# Patient Record
Sex: Female | Born: 1985 | Race: White | Hispanic: No | Marital: Married | Smoking: Former smoker
Health system: Southern US, Community
[De-identification: ages and names within clinical notes are randomized; demographics above are authoritative.]

## PROBLEM LIST (undated history)

## (undated) DIAGNOSIS — K219 Gastro-esophageal reflux disease without esophagitis: Secondary | ICD-10-CM

## (undated) DIAGNOSIS — J189 Pneumonia, unspecified organism: Secondary | ICD-10-CM

## (undated) DIAGNOSIS — I10 Essential (primary) hypertension: Secondary | ICD-10-CM

## (undated) HISTORY — PX: MANDIBLE FRACTURE SURGERY: SHX706

---
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
## 2006-10-29 ENCOUNTER — Emergency Department: Payer: Self-pay

## 2008-11-24 ENCOUNTER — Emergency Department: Payer: Self-pay | Admitting: Emergency Medicine

## 2010-05-18 ENCOUNTER — Emergency Department: Payer: Self-pay | Admitting: Emergency Medicine

## 2010-09-17 ENCOUNTER — Emergency Department: Payer: Self-pay | Admitting: Emergency Medicine

## 2010-12-29 ENCOUNTER — Emergency Department: Payer: Self-pay | Admitting: Unknown Physician Specialty

## 2011-10-07 ENCOUNTER — Inpatient Hospital Stay: Payer: Self-pay | Admitting: Internal Medicine

## 2011-10-07 LAB — CBC
HCT: 39.6 % (ref 35.0–47.0)
HGB: 13.3 g/dL (ref 12.0–16.0)
MCHC: 33.6 g/dL (ref 32.0–36.0)
MCV: 91 fL (ref 80–100)
Platelet: 296 10*3/uL (ref 150–440)
RBC: 4.37 10*6/uL (ref 3.80–5.20)
RDW: 12.9 % (ref 11.5–14.5)

## 2011-10-07 LAB — COMPREHENSIVE METABOLIC PANEL
Albumin: 4.1 g/dL (ref 3.4–5.0)
Alkaline Phosphatase: 65 U/L (ref 50–136)
Calcium, Total: 9.6 mg/dL (ref 8.5–10.1)
Co2: 24 mmol/L (ref 21–32)
Glucose: 86 mg/dL (ref 65–99)
SGOT(AST): 72 U/L — ABNORMAL HIGH (ref 15–37)
Sodium: 141 mmol/L (ref 136–145)
Total Protein: 8.4 g/dL — ABNORMAL HIGH (ref 6.4–8.2)

## 2011-10-07 LAB — PREGNANCY, URINE: Pregnancy Test, Urine: NEGATIVE m[IU]/mL

## 2011-10-08 LAB — CBC WITH DIFFERENTIAL/PLATELET
Basophil #: 0 10*3/uL (ref 0.0–0.1)
Eosinophil %: 0 %
HCT: 35.2 % (ref 35.0–47.0)
HGB: 11.7 g/dL — ABNORMAL LOW (ref 12.0–16.0)
Lymphocyte #: 0.7 10*3/uL — ABNORMAL LOW (ref 1.0–3.6)
MCH: 29.9 pg (ref 26.0–34.0)
MCHC: 33.2 g/dL (ref 32.0–36.0)
MCV: 90 fL (ref 80–100)
Monocyte %: 1.6 %
RDW: 12.9 % (ref 11.5–14.5)
WBC: 11.2 10*3/uL — ABNORMAL HIGH (ref 3.6–11.0)

## 2011-10-08 LAB — HEPATIC FUNCTION PANEL A (ARMC)
Alkaline Phosphatase: 56 U/L (ref 50–136)
SGOT(AST): 65 U/L — ABNORMAL HIGH (ref 15–37)
SGPT (ALT): 19 U/L
Total Protein: 7.6 g/dL (ref 6.4–8.2)

## 2011-10-08 LAB — BASIC METABOLIC PANEL
Anion Gap: 11 (ref 7–16)
Calcium, Total: 9.3 mg/dL (ref 8.5–10.1)
Chloride: 108 mmol/L — ABNORMAL HIGH (ref 98–107)
Co2: 22 mmol/L (ref 21–32)
Creatinine: 0.88 mg/dL (ref 0.60–1.30)
EGFR (Non-African Amer.): >60
Glucose: 141 mg/dL — ABNORMAL HIGH (ref 65–99)
Osmolality: 283 (ref 275–301)
Potassium: 4.4 mmol/L (ref 3.5–5.1)
Sodium: 141 mmol/L (ref 136–145)

## 2011-10-15 LAB — EXPECTORATED SPUTUM ASSESSMENT W GRAM STAIN, RFLX TO RESP C

## 2012-05-19 ENCOUNTER — Ambulatory Visit: Payer: Self-pay | Admitting: General Practice

## 2013-11-27 ENCOUNTER — Ambulatory Visit: Payer: Self-pay | Admitting: General Practice

## 2015-01-05 NOTE — Consult Note (Signed)
PATIENT NAME:  Linda Benitez, Linda Benitez MR#:  [id number] DATE OF BIRTH:  07/16/1986  DATE OF CONSULTATION:  10/09/2011  CONSULTING PHYSICIAN:  Yevonne PaxSaadat A. Eliezer Khawaja, MD  REASON FOR CONSULTATION: Acute pneumonia.   HISTORY OF PRESENT ILLNESS: The patient is a 29 year old female who has past medical history significant for childhood asthma which was very transient. She came into the hospital because she has not been quite feeling well. She says that she first got sick around Christmas. At that time she went to a physician and  was given some Z-Pak. The patient also stated at that time she started using albuterol inhaler. She said that she had been having some cough and congestion, and she was bringing up some sputum. There was no hemoptysis noted. She was not able to sleep well, and also she said that she had such severe coughing that her ribs began to hurt. When she this ill, she came into the Emergency Room. At the time that she was seen she was started on steroids and antibiotics, and she says that since admission she has been feeling better.   PAST MEDICAL HISTORY: Significant for asthma.   PAST SURGICAL HISTORY: Jaw fracture.   SOCIAL HISTORY: Positive for tobacco use. She quit the first time that she got ill, which would be back in December. She works in an office.   FAMILY HISTORY: Negative other than for renal cell carcinoma in her father.   REVIEW OF SYSTEMS: GENERAL: Generally, she has been having fatigue and inability to sleep well. ENT: Positive for runny nose. RESPIRATORY: Positive for cough and shortness of breath. CARDIOVASCULAR: Positive for pain in the chest related to the coughing. GASTROINTESTINAL: Negative for any nausea or vomiting. There was some diarrhea noted. GENITOURINARY: Negative for any hematuria. MUSCULOSKELETAL: Negative for any arthropathy. SKIN: Without rashes. HEMATOLOGIC/LYMPHATIC: Negative for any bruising or bleeding. PSYCHIATRIC: Negative for depression or  anxiety.  PHYSICAL EXAMINATION:  GENERAL: At the time that she was seen she was awake. She was comfortable.   VITAL SIGNS: She had a temperature of 98.4, pulse 81, respiratory rate 20, blood pressure 102/63, saturations were 94%.   NECK: Neck was supple. There was no JVD. No adenopathy. No thyromegaly.   CHEST: Some coarse breath sounds. A few scattered rhonchi. There were no rales.   CARDIOVASCULAR: S1, S2 normal. Regular rhythm. No gallop or rub.   ABDOMEN: Soft and nontender.   EXTREMITIES: Without cyanosis or clubbing. Pulses equal.   NEUROLOGIC: She was awake and alert, moving all four extremities.   MUSCULOSKELETAL: Without any acute synovitis.   LABORATORY, DIAGNOSTIC AND RADIOLOGICAL DATA:  CT chest was shown which showed no pulmonary emboli, but she did have patchy infiltrates consistent with pneumonitis.  Also, she had a hepatitis screen done which was negative.  She had an admission CBC which was 14.8. Follow-up CBC with showed white count down to 11.2   IMPRESSION: Acute pneumonitis.   PLAN: She is right now being given Levaquin, and this should be continued. She was also given steroids which likely helped. She will continue with her inhalers. I would suggest on discharge that should follow-up in the office so we can make certain that this has cleared. I will make further recommendations as deemed necessary.  ____________________________ Yevonne PaxSaadat A. Guido Comp, MD sak:cbb D: 10/09/2011 20:06:27 ET T: 10/10/2011 15:55:31 ET JOB#: [id number]  cc: Yevonne PaxSaadat A. Lilliam Chamblee, MD, <Dictator> Yevonne PaxSAADAT A Tamir Wallman MD ELECTRONICALLY SIGNED 11/12/2011 10:17

## 2015-01-05 NOTE — H&P (Signed)
PATIENT NAME:  Lonni FixSTALLINGS, Darcel R MR#:  [id number] DATE OF BIRTH:  [birth year]  DATE OF ADMISSION:  10/07/2011  PRIMARY CARE PHYSICIAN: None.   CHIEF COMPLAINT: Sick since Christmas.   HISTORY OF PRESENT ILLNESS: This is a 29 year old female with history of childhood asthma. She states that she has been sick since Christmas. She has had three courses of Z-Pak, two over-the-counter cough medications, using her albuterol inhaler. She has been short of breath and coughing, bad phlegm changes in color from clear to green, she cannot sleep, she has had decreased appetite. She has got pain on bilateral lower chest in the front. Of note, she does have a new dog that is living in the house since Christmas time. Patient in the ER found to be tachycardic on presentation, leukocytosis. Patient was given a continuous nebulizer and Solu-Medrol in the Emergency Room. Hospitalist services were contacted for evaluation.   PAST MEDICAL HISTORY: Childhood asthma.   PAST SURGICAL HISTORY: Jaw fracture.   ALLERGIES: No known drug allergies.   MEDICATIONS:  1. Birth control pills.  2. She is using her albuterol inhaler q.4 hours.   SOCIAL HISTORY: She quit smoking two months ago. Occasional alcohol. No drug use. 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    FAMILY HISTORY: Mother is healthy. Father died at 447 of renal cell carcinoma, also had hypertension.    REVIEW OF SYSTEMS: CONSTITUTIONAL: Feels hot. No fever or chills. Positive for fatigue, not sleeping. Positive for weight loss secondary to decreased appetite. EARS, NOSE, MOUTH, AND THROAT: Positive for runny nose. No sore throat. No difficulty swallowing. CARDIOVASCULAR: No chest pain. No palpitations. RESPIRATORY: Positive for shortness of breath, pain on the ribs with deep breath. Continually coughing at home which brings her to vomiting. GASTROINTESTINAL: No nausea but positive for vomiting, abdominal pain with the coughing. No  constipation. Positive for diarrhea one day last week. No bright red blood per rectum. No melena. GENITOURINARY: No burning on urination. No hematuria. MUSCULOSKELETAL: No joint pain or muscle pain. INTEGUMENT: No rashes or eruptions. NEUROLOGIC: No fainting or blackouts but dizziness. PSYCHIATRIC: No anxiety, depression. ENDOCRINE: No thyroid problems. HEMATOLOGIC/LYMPHATIC: No anemia. No easy bruising or bleeding.   PHYSICAL EXAMINATION:  VITAL SIGNS: Temperature 98.4, pulse 140, respirations 24, blood pressure 152/77, pulse oximetry 97% on room air.   EYES: Conjunctivae and lids normal. Pupils equal, round, and reactive to light. Extraocular muscles intact. No nystagmus.   EARS, NOSE, MOUTH, AND THROAT: Tympanic membranes bulging and erythematous. Nasal mucosa no erythema. Throat no erythema. No exudate seen. Lips and gums no lesions.   NECK: No JVD. No bruits. No lymphadenopathy. No thyromegaly. No thyroid nodules palpated.   RESPIRATORY: No use of accessory muscles to breathe. Decreased breath sounds bilaterally. No rhonchi, rales, or wheeze heard. In speaking with the ER physician the patient did have wheezing when she came in.   CARDIOVASCULAR: S1, S2 tachycardic. No gallops, rubs, or murmurs heard. Carotid upstrokes 2+ bilaterally. No bruits. Dorsalis pedis pulses 2+ bilaterally. No edema lower extremity.   ABDOMEN: Soft, nontender. No organomegaly/splenomegaly. Normoactive bowel sounds. No masses felt.   CHEST WALL: Patient having pain to palpation over lower ribs bilaterally.   LYMPHATIC: No lymph nodes in the neck.   MUSCULOSKELETAL: No clubbing, edema, or cyanosis.   SKIN: No rashes or ulcers seen.   NEUROLOGIC: Cranial nerves II through XII grossly intact. Deep tendon reflexes 2+ bilateral lower extremities.   PSYCHIATRIC: Patient is oriented  to person, place, and time.   LABORATORY, DIAGNOSTIC AND RADIOLOGICAL DATA: Pregnancy test negative. Chest x-ray negative. White blood  cell count 14.8, hemoglobin and hematocrit 13.3 and 39.6, platelet count 296, glucose 86, BUN 9, creatinine 0.87, sodium 141, potassium 3.5, chloride 103, CO2 24, calcium 9.6, AST slightly elevated at 72. Total protein slightly elevated at 84. EKG: Sinus tachycardia, 104 beats per minute.   ASSESSMENT AND PLAN:  1. Asthma exacerbation, systemic inflammatory response syndrome. Will put on albuterol nebulizer, Solu-Medrol 125 mg IV x1 given already in ER, will continue 40 mg IV q.6 hours. Will also give Flovent inhaler. Will give Levaquin with bulging and erythematous tympanic membranes; most likely upper respiratory tract infection causing. Could all be related to the new dog that they did get, not quite sure.  2. Sinus tachycardia, persistent pleuritic chest pain. Patient is on birth control pills. I would like to rule out pulmonary embolism. Will get a CT scan of the chest to rule out PE.  3. Elevated AST. Will send off hepatitis profile. Repeat liver function test in the a.m.  4. Vomiting, most likely secondary to coughing. Will add p.r.n. Zofran.  5. Elevated blood pressure. May be secondary with the difficulty breathing.  6. Hypokalemia. Will give a potassium supplementation.   TIME SPENT ON PATIENT ADMISSION: 50 minutes.    ____________________________ Herschell Dimes. Renae Gloss, MD rjw:cms D: 10/07/2011 20:15:32 ET T: 10/08/2011 06:02:50 ET JOB#: [id number]  cc: Herschell Dimes. Renae Gloss, MD, <Dictator> Salley Scarlet MD ELECTRONICALLY SIGNED 10/22/2011 16:16

## 2015-01-05 NOTE — Discharge Summary (Signed)
PATIENT NAME:  Linda Benitez, Linda Benitez MR#:  [id number] DATE OF BIRTH:  10/03/1985  DATE OF ADMISSION:  10/07/2011 DATE OF DISCHARGE:  10/09/2011  DISCHARGE DIAGNOSES:  1. Asthma exacerbation. 2. SIRS. 3. Pneumonia.  4. Elevated AST. 5. Hypokalemia.  6. Hyperglycemia.   DISPOSITION: The patient is being discharged home. Follow up at the [facility] 1 to 2 weeks after discharge.   DIET: Regular.   ACTIVITY: As tolerated.   DISCHARGE MEDICATIONS:  1. Levaquin 500 mg daily for five days. 2. Tussionex 5 mL p.o. b.i.d. for cough. 3. Prednisone taper as prescribed.  4. ProAir HFA q. 6 hours p.Benitez.n.  5. Azaline? 100 mcg/20 mcg 1 tablet daily.   RESULTS:  Chest x-ray showed no acute cardiopulmonary disease. CT scan of the chest showed bilateral upper lobe patchy pulmonary infiltrates. Hepatitis profile negative. Urine pregnancy test negative. White count 14.8 to 11.2. Normal hemoglobin and platelet count. Glucose 141 after use of steroids. AST ranging  from 70 to 265. Normal renal function.   HOSPITAL COURSE: The patient is a 29 year old female with past medical history of asthma who presented with subacute shortness of breath, coughing, and wheezing. She was admitted with a diagnosis of systemic inflammatory response syndrome and asthma exacerbation and started on empiric antibiotics, nebulizer treatments, and steroids. Initial chest x-ray did not show any pneumonia. However, CT scan of the chest showed bilateral patchy infiltrates. The radiologist expressed concern for possible granulomatous disease/tuberculosis. However, the patient was an Games developerMT student and had a negative PPD done in May 2012 and another negative PPD done about one week from admission. There was clinically no evidence of tuberculosis. The patient denied any recent weight loss, loss of appetite, night sweats, cough, or hemoptysis.  Her leukocytosis got better with treatment of her pneumonia. The patient was found to have mildly  elevated AST. Hepatitis panel was checked and it was negative. She had mild hypokalemia which was supplemented. She had steroid-induced hyperglycemia. Overall, the patient remained stable throughout the hospitalization and symptomatically improved. She is being discharged home in a stable condition.   TIME SPENT: 45 minutes.    ____________________________ Darrick MeigsSangeeta Kellin Fifer, MD sp:bjt D: 10/09/2011 14:56:54 ET T: 10/09/2011 15:42:22 ET JOB#: [id number]  cc: Darrick MeigsSangeeta Feliciana Narayan, MD, <Dictator> [facility] Darrick MeigsSANGEETA Maryna Yeagle MD ELECTRONICALLY SIGNED 10/11/2011 15:10

## 2015-06-10 ENCOUNTER — Encounter: Payer: Self-pay | Admitting: Physician Assistant

## 2015-06-10 ENCOUNTER — Ambulatory Visit: Payer: Self-pay | Admitting: Physician Assistant

## 2015-06-10 VITALS — BP 120/70 | HR 73 | Temp 98.0°F

## 2015-06-10 DIAGNOSIS — F4322 Adjustment disorder with anxiety: Secondary | ICD-10-CM

## 2015-06-10 MED ORDER — VENLAFAXINE HCL ER 75 MG PO CP24: 75.0000 mg | ORAL_CAPSULE | Freq: Every day | ORAL | Status: AC

## 2015-06-10 NOTE — Progress Notes (Signed)
S: C/o anxiety and panic attacks, had cp/sob/crying yesterday and couldn't go to work,  Denies sx at this time, has an appointment with the EAP tomorrow, was on Effexor Xl  qd previously for anxiety but quit taking it once her problems at home got better.  Denies SI/HI  O: vitals wnl, nad, tearful, lungs c t a,cv rrr, neuro intact  A:  Anxiety  P: effexor xl  qd, f/u with EAP

## 2015-07-22 ENCOUNTER — Encounter: Payer: Self-pay | Admitting: Physician Assistant

## 2015-07-22 ENCOUNTER — Ambulatory Visit: Payer: Self-pay | Admitting: Physician Assistant

## 2015-07-22 VITALS — BP 120/70 | HR 87 | Temp 98.2°F

## 2015-07-22 DIAGNOSIS — R197 Diarrhea, unspecified: Secondary | ICD-10-CM

## 2015-07-22 LAB — POCT URINALYSIS DIPSTICK
Bilirubin, UA: NEGATIVE
Glucose, UA: NEGATIVE
KETONES UA: NEGATIVE
Leukocytes, UA: NEGATIVE
Nitrite, UA: NEGATIVE
PH UA: 6
PROTEIN UA: NEGATIVE
Urobilinogen, UA: 0.2

## 2015-07-22 NOTE — Patient Instructions (Signed)
Diarrhea °Diarrhea is watery poop (stool). It can make you feel weak, tired, thirsty, or give you a dry mouth (signs of dehydration). Watery poop is a sign of another problem, most often an infection. It often lasts 2-3 days. It can last longer if it is a sign of something serious. Take care of yourself as told by your doctor. °HOME CARE  °· Drink 1 cup (8 ounces) of fluid each time you have watery poop. °· Do not drink the following fluids: °¨ Those that contain simple sugars (fructose, glucose, galactose, lactose, sucrose, maltose). °¨ Sports drinks. °¨ Fruit juices. °¨ Whole milk products. °¨ Sodas. °¨ Drinks with caffeine (coffee, tea, soda) or alcohol. °· Oral rehydration solution may be used if the doctor says it is okay. You may make your own solution. Follow this recipe: °¨  - teaspoon table salt. °¨ ¾ teaspoon baking soda. °¨  teaspoon salt substitute containing potassium chloride. °¨ 1 tablespoons sugar. °¨ 1 liter (34 ounces) of water. °· Avoid the following foods: °¨ High fiber foods, such as raw fruits and vegetables. °¨ Nuts, seeds, and whole grain breads and cereals. °¨  Those that are sweetened with sugar alcohols (xylitol, sorbitol, mannitol). °· Try eating the following foods: °¨ Starchy foods, such as rice, toast, pasta, low-sugar cereal, oatmeal, baked potatoes, crackers, and bagels. °¨ Bananas. °¨ Applesauce. °· Eat probiotic-rich foods, such as yogurt and milk products that are fermented. °· Wash your hands well after each time you have watery poop. °· Only take medicine as told by your doctor. °· Take a warm bath to help lessen burning or pain from having watery poop. °GET HELP RIGHT AWAY IF:  °· You cannot drink fluids without throwing up (vomiting). °· You keep throwing up. °· You have blood in your poop, or your poop looks black and tarry. °· You do not pee (urinate) in 6-8 hours, or there is only a small amount of very dark pee. °· You have belly (abdominal) pain that gets worse or stays  in the same spot (localizes). °· You are weak, dizzy, confused, or light-headed. °· You have a very bad headache. °· Your watery poop gets worse or does not get better. °· You have a fever or lasting symptoms for more than 2-3 days. °· You have a fever and your symptoms suddenly get worse. °MAKE SURE YOU:  °· Understand these instructions. °· Will watch your condition. °· Will get help right away if you are not doing well or get worse. °  °This information is not intended to replace advice given to you by your health care provider. Make sure you discuss any questions you have with your health care provider. °  °Document Released: 02/16/2008 Document Revised: 09/20/2014 Document Reviewed: 05/07/2012 °Elsevier Interactive Patient Education ©2016 [employer] ° °Food Choices to Help Relieve Diarrhea, Adult °When you have diarrhea, the foods you eat and your eating habits are very important. Choosing the right foods and drinks can help relieve diarrhea. Also, because diarrhea can last up to 7 days, you need to replace lost fluids and electrolytes (such as sodium, potassium, and chloride) in order to help prevent dehydration.  °WHAT GENERAL GUIDELINES DO I NEED TO FOLLOW? °· Slowly drink 1 cup (8 oz) of fluid for each episode of diarrhea. If you are getting enough fluid, your urine will be clear or pale yellow. °· Eat starchy foods. Some good choices include white rice, white toast, pasta, low-fiber cereal, baked potatoes (without the skin), saltine   crackers, and bagels. °· Avoid large servings of any cooked vegetables. °· Limit fruit to two servings per day. A serving is ½ cup or 1 small piece. °· Choose foods with less than 2 g of fiber per serving. °· Limit fats to less than 8 tsp (38 g) per day. °· Avoid fried foods. °· Eat foods that have probiotics in them. Probiotics can be found in certain dairy products. °· Avoid foods and beverages that may increase the speed at which food moves through the stomach and  intestines (gastrointestinal tract). Things to avoid include: °¨ High-fiber foods, such as dried fruit, raw fruits and vegetables, nuts, seeds, and whole grain foods. °¨ Spicy foods and high-fat foods. °¨ Foods and beverages sweetened with high-fructose corn syrup, honey, or sugar alcohols such as xylitol, sorbitol, and mannitol. °WHAT FOODS ARE RECOMMENDED? °Grains °White rice. White, French, or pita breads (fresh or toasted), including plain rolls, buns, or bagels. White pasta. Saltine, soda, or graham crackers. Pretzels. Low-fiber cereal. Cooked cereals made with water (such as cornmeal, farina, or cream cereals). Plain muffins. Matzo. Melba toast. Zwieback.  °Vegetables °Potatoes (without the skin). Strained tomato and vegetable juices. Most well-cooked and canned vegetables without seeds. Tender lettuce. °Fruits °Cooked or canned applesauce, apricots, cherries, fruit cocktail, grapefruit, peaches, pears, or plums. Fresh bananas, apples without skin, cherries, grapes, cantaloupe, grapefruit, peaches, oranges, or plums.  °Meat and Other Protein Products °Baked or boiled chicken. Eggs. Tofu. Fish. Seafood. Smooth peanut butter. Ground or well-cooked tender beef, ham, veal, lamb, pork, or poultry.  °Dairy °Plain yogurt, kefir, and unsweetened liquid yogurt. Lactose-free milk, buttermilk, or soy milk. Plain hard cheese. °Beverages °Sport drinks. Clear broths. Diluted fruit juices (except prune). Regular, caffeine-free sodas such as ginger ale. Water. Decaffeinated teas. Oral rehydration solutions. Sugar-free beverages not sweetened with sugar alcohols. °Other °Bouillon, broth, or soups made from recommended foods.  °The items listed above may not be a complete list of recommended foods or beverages. Contact your dietitian for more options. °WHAT FOODS ARE NOT RECOMMENDED? °Grains °Whole grain, whole wheat, bran, or rye breads, rolls, pastas, crackers, and cereals. Wild or brown rice. Cereals that contain more than 2  g of fiber per serving. Corn tortillas or taco shells. Cooked or dry oatmeal. Granola. Popcorn. °Vegetables °Raw vegetables. Cabbage, broccoli, Brussels sprouts, artichokes, baked beans, beet greens, corn, kale, legumes, peas, sweet potatoes, and yams. Potato skins. Cooked spinach and cabbage. °Fruits °Dried fruit, including raisins and dates. Raw fruits. Stewed or dried prunes. Fresh apples with skin, apricots, mangoes, pears, raspberries, and strawberries.  °Meat and Other Protein Products °Chunky peanut butter. Nuts and seeds. Beans and lentils. Bacon.  °Dairy °High-fat cheeses. Milk, chocolate milk, and beverages made with milk, such as milk shakes. Cream. Ice cream. °Sweets and Desserts °Sweet rolls, doughnuts, and sweet breads. Pancakes and waffles. °Fats and Oils °Butter. Cream sauces. Margarine. Salad oils. Plain salad dressings. Olives. Avocados.  °Beverages °Caffeinated beverages (such as coffee, tea, soda, or energy drinks). Alcoholic beverages. Fruit juices with pulp. Prune juice. Soft drinks sweetened with high-fructose corn syrup or sugar alcohols. °Other °Coconut. Hot sauce. Chili powder. Mayonnaise. Gravy. Cream-based or milk-based soups.  °The items listed above may not be a complete list of foods and beverages to avoid. Contact your dietitian for more information. °WHAT SHOULD I DO IF I BECOME DEHYDRATED? °Diarrhea can sometimes lead to dehydration. Signs of dehydration include dark urine and dry mouth and skin. If you think you are dehydrated, you should rehydrate with an   oral rehydration solution. These solutions can be purchased at pharmacies, retail stores, or online.  °Drink ½-1 cup (120-240 mL) of oral rehydration solution each time you have an episode of diarrhea. If drinking this amount makes your diarrhea worse, try drinking smaller amounts more often. For example, drink 1-3 tsp (5-15 mL) every 5-10 minutes.  °A general rule for staying hydrated is to drink 1½-2 L of fluid per day. Talk to  your health care provider about the specific amount you should be drinking each day. Drink enough fluids to keep your urine clear or pale yellow. °  °This information is not intended to replace advice given to you by your health care provider. Make sure you discuss any questions you have with your health care provider. °  °Document Released: 11/20/2003 Document Revised: 09/20/2014 Document Reviewed: 07/23/2013 °Elsevier Interactive Patient Education ©2016 Elsevier Inc. ° °

## 2015-07-22 NOTE — Progress Notes (Signed)
S:  Pt c/o  diarrhea, sx for 1 day, no fever/chills, no abd pain except for cramping with diarrhea; denies cp/sob, denies camping, bad food, recent antibiotics, or exposure to bad water Remainder ros neg  O:  Vitals wnl, nad, ENT wnl, neck supple no lymph, lungs c t a, cv rrr, abd soft nontender bs normal in all 4 quads, neuro intact  A:  Viral gastroenteritis  P:  Reassurance, fluids, brat diet, immodium ad for diarrhea if needed, return if not better in 3 days, return earlier if worsening

## 2015-08-19 ENCOUNTER — Emergency Department: Payer: PRIVATE HEALTH INSURANCE

## 2015-08-19 ENCOUNTER — Encounter: Payer: Self-pay | Admitting: Emergency Medicine

## 2015-08-19 ENCOUNTER — Emergency Department
Admission: EM | Admit: 2015-08-19 | Discharge: 2015-08-19 | Disposition: A | Discharge status: Home / Self Care | Payer: PRIVATE HEALTH INSURANCE | Attending: Emergency Medicine | Admitting: Emergency Medicine

## 2015-08-19 DIAGNOSIS — Z87891 Personal history of nicotine dependence: Secondary | ICD-10-CM | POA: Insufficient documentation

## 2015-08-19 DIAGNOSIS — J40 Bronchitis, not specified as acute or chronic: Secondary | ICD-10-CM | POA: Diagnosis not present

## 2015-08-19 DIAGNOSIS — R05 Cough: Secondary | ICD-10-CM | POA: Diagnosis present

## 2015-08-19 MED ORDER — PREDNISONE 50 MG PO TABS: 50.0000 mg | ORAL_TABLET | Freq: Every day | ORAL | Status: DC

## 2015-08-19 NOTE — ED Provider Notes (Signed)
[facility] Emergency Department Provider Note  ____________________________________________  Time seen: On arrival  I have reviewed the triage vital signs and the nursing notes.   HISTORY  Chief Complaint Cough    HPI Linda Benitez is a 29 y.o. female who presents with cough for approximately 2 weeks. She is concerned because she reports she has had pneumonia in the past although she states typically she has been treated with prednisone for her pneumonia which makes me suspect she is confusing for bronchitis. No fevers or chills. No recent travel. No calf pain. No history of DVT. No pleurisy. Patient is taking amoxicillin for sinus infection as well  History reviewed. No pertinent past medical history.  There are no active problems to display for this patient.   Past Surgical History  Procedure Laterality Date  . Mandible fracture surgery      Current Outpatient Rx  Name  Route  Sig  Dispense  Refill  . predniSONE (DELTASONE) 50 MG tablet   Oral   Take 1 tablet (50 mg total) by mouth daily with breakfast.   5 tablet   0   . venlafaxine XR (EFFEXOR XR) 75 MG 24 hr capsule   Oral   Take 1 capsule (75 mg total) by mouth daily with breakfast. Patient not taking: Reported on 07/22/2015   30 capsule   4     Allergies Review of patient's allergies indicates no known allergies.  No family history on file.  Social History Social History  Substance Use Topics  . Smoking status: Former Games developer  . Smokeless tobacco: None  . Alcohol Use: No    Review of Systems  Constitutional: Negative for fever. Eyes: Negative for visual changes. ENT: Negative for sore throat, positive for sinus infection Respiratory: Positive for cough  Genitourinary: Negative for dysuria. Musculoskeletal: Negative for back pain. Skin: Negative for rash. Neurological: Negative for headaches    ____________________________________________   PHYSICAL  EXAM:  VITAL SIGNS: ED Triage Vitals  Enc Vitals Group     BP 08/19/15 0440 126/81 mmHg     Pulse Rate 08/19/15 0440 102     Resp 08/19/15 0440 22     Temp 08/19/15 0440 97.5 F (36.4 C)     Temp Source 08/19/15 0440 Oral     SpO2 08/19/15 0440 98 %     Weight 08/19/15 0440 178 lb (80.74 kg)     Height 08/19/15 0440  (1.626 m)     Head Cir --      Peak Flow --      Pain Score --      Pain Loc --      Pain Edu? --      Excl. in GC? --      Constitutional: Alert and oriented. Well appearing and in no distress. Eyes: Conjunctivae are normal.  ENT   Head: Normocephalic and atraumatic.   Mouth/Throat: Mucous membranes are moist. Cardiovascular: Normal rate, regular rhythm. On my exam heart rate 88 Respiratory: Normal respiratory effort without tachypnea nor retractions. No wheezes or rales  Musculoskeletal: Nontender with normal range of motion in all extremities. Neurologic:  Normal speech and language. No gross focal neurologic deficits are appreciated. Skin:  Skin is warm, dry and intact. No rash noted. Psychiatric: Mood and affect are normal. Patient exhibits appropriate insight and judgment.  ____________________________________________    LABS (pertinent positives/negatives)  Labs Reviewed - No data to display  ____________________________________________     ____________________________________________  RADIOLOGY I have personally reviewed any xrays that were ordered on this patient: Chest x-ray unremarkable  ____________________________________________   PROCEDURES  Procedure(s) performed: none   ____________________________________________   INITIAL IMPRESSION / ASSESSMENT AND PLAN / ED COURSE  Pertinent labs & imaging results that were available during my care of the patient were reviewed by me and considered in my medical decision making (see chart for details).  Patient well appearing. No distress. Benign exam and chest x-ray.  History of present illness most consistent with bronchitis given sinus drainage followed by cough for 2 weeks. No hemoptysis, no history of DVT or PE. We will treat with steroids and albuterol and have the patient follow-up as an outpatient. Return precautions discussed.  ____________________________________________   FINAL CLINICAL IMPRESSION(S) / ED DIAGNOSES  Final diagnoses:  Bronchitis     Jene Everyobert Aysiah Jurado, MD 08/19/15 [id number]

## 2015-08-19 NOTE — ED Notes (Signed)
Patient ambulatory to triage with steady gait, without difficulty or distress noted; pt reports prod cough green sputum x 1-2weeks; currently taking amoxi for sinus infection (started 4-5 days ago)

## 2015-08-19 NOTE — Discharge Instructions (Signed)
Upper Respiratory Infection, Adult Most upper respiratory infections (URIs) are a viral infection of the air passages leading to the lungs. A URI affects the nose, throat, and upper air passages. The most common type of URI is nasopharyngitis and is typically referred to as "the common cold." URIs run their course and usually go away on their own. Most of the time, a URI does not require medical attention, but sometimes a bacterial infection in the upper airways can follow a viral infection. This is called a secondary infection. Sinus and middle ear infections are common types of secondary upper respiratory infections. Bacterial pneumonia can also complicate a URI. A URI can worsen asthma and chronic obstructive pulmonary disease (COPD). Sometimes, these complications can require emergency medical care and may be life threatening.  CAUSES Almost all URIs are caused by viruses. A virus is a type of germ and can spread from one person to another.  RISKS FACTORS You may be at risk for a URI if:   You smoke.   You have chronic heart or lung disease.  You have a weakened defense (immune) system.   You are very young or very old.   You have nasal allergies or asthma.  You work in crowded or poorly ventilated areas.  You work in health care facilities or schools. SIGNS AND SYMPTOMS  Symptoms typically develop 2-3 days after you come in contact with a cold virus. Most viral URIs last 7-10 days. However, viral URIs from the influenza virus (flu virus) can last 14-18 days and are typically more severe. Symptoms may include:   Runny or stuffy (congested) nose.   Sneezing.   Cough.   Sore throat.   Headache.   Fatigue.   Fever.   Loss of appetite.   Pain in your forehead, behind your eyes, and over your cheekbones (sinus pain).  Muscle aches.  DIAGNOSIS  Your health care provider may diagnose a URI by:  Physical exam.  Tests to check that your symptoms are not due to  another condition such as:  Strep throat.  Sinusitis.  Pneumonia.  Asthma. TREATMENT  A URI goes away on its own with time. It cannot be cured with medicines, but medicines may be prescribed or recommended to relieve symptoms. Medicines may help:  Reduce your fever.  Reduce your cough.  Relieve nasal congestion. HOME CARE INSTRUCTIONS   Take medicines only as directed by your health care provider.   Gargle warm saltwater or take cough drops to comfort your throat as directed by your health care provider.  Use a warm mist humidifier or inhale steam from a shower to increase air moisture. This may make it easier to breathe.  Drink enough fluid to keep your urine clear or pale yellow.   Eat soups and other clear broths and maintain good nutrition.   Rest as needed.   Return to work when your temperature has returned to normal or as your health care provider advises. You may need to stay home longer to avoid infecting others. You can also use a face mask and careful hand washing to prevent spread of the virus.  Increase the usage of your inhaler if you have asthma.   Do not use any tobacco products, including cigarettes, chewing tobacco, or electronic cigarettes. If you need help quitting, ask your health care provider. PREVENTION  The best way to protect yourself from getting a cold is to practice good hygiene.   Avoid oral or hand contact with people with cold   symptoms.   Wash your hands often if contact occurs.  There is no clear evidence that vitamin C, vitamin E, echinacea, or exercise reduces the chance of developing a cold. However, it is always recommended to get plenty of rest, exercise, and practice good nutrition.  SEEK MEDICAL CARE IF:   You are getting worse rather than better.   Your symptoms are not controlled by medicine.   You have chills.  You have worsening shortness of breath.  You have brown or red mucus.  You have yellow or brown nasal  discharge.  You have pain in your face, especially when you bend forward.  You have a fever.  You have swollen neck glands.  You have pain while swallowing.  You have white areas in the back of your throat. SEEK IMMEDIATE MEDICAL CARE IF:   You have severe or persistent:  Headache.  Ear pain.  Sinus pain.  Chest pain.  You have chronic lung disease and any of the following:  Wheezing.  Prolonged cough.  Coughing up blood.  A change in your usual mucus.  You have a stiff neck.  You have changes in your:  Vision.  Hearing.  Thinking.  Mood. MAKE SURE YOU:   Understand these instructions.  Will watch your condition.  Will get help right away if you are not doing well or get worse.   This information is not intended to replace advice given to you by your health care provider. Make sure you discuss any questions you have with your health care provider.   Document Released: 02/23/2001 Document Revised: 01/14/2015 Document Reviewed: 12/05/2013 Elsevier Interactive Patient Education 2016 Elsevier Inc.  

## 2015-08-19 NOTE — ED Notes (Signed)
Patient presents to ED with cough and thoughts that she probably has pneumonia again. Has had a few bouts of PNE in the past and this feels the same. Used her nebulizer prior to coming to ED but it has not helped. Is currently taking Amoxicillin for a sinus infection which seems to have helped some but is not effecting her pneumonia symptoms. MD into room at this time for evaluation.

## 2015-09-09 ENCOUNTER — Encounter: Payer: Self-pay | Admitting: Physician Assistant

## 2015-09-09 ENCOUNTER — Ambulatory Visit: Payer: Self-pay | Admitting: Physician Assistant

## 2015-09-09 VITALS — BP 119/80 | HR 95 | Temp 98.5°F

## 2015-09-09 DIAGNOSIS — J4531 Mild persistent asthma with (acute) exacerbation: Secondary | ICD-10-CM

## 2015-09-09 MED ORDER — PREDNISONE 10 MG (48) PO TBPK: ORAL_TABLET | Freq: Every day | ORAL | Status: DC

## 2015-09-09 MED ORDER — CEFDINIR 300 MG PO CAPS: 300.0000 mg | ORAL_CAPSULE | Freq: Two times a day (BID) | ORAL | Status: DC

## 2015-09-09 MED ORDER — FLUCONAZOLE 150 MG PO TABS: 150.0000 mg | ORAL_TABLET | Freq: Once | ORAL | Status: DC

## 2015-09-09 MED ORDER — FLUTICASONE-SALMETEROL 115-21 MCG/ACT IN AERO: 2.0000 | INHALATION_SPRAY | Freq: Two times a day (BID) | RESPIRATORY_TRACT | Status: AC

## 2015-09-09 MED ORDER — HYDROCOD POLST-CPM POLST ER 10-8 MG/5ML PO SUER: 5.0000 mL | Freq: Two times a day (BID) | ORAL | Status: DC | PRN

## 2015-09-09 NOTE — Progress Notes (Signed)
S: c/o cough and wheezing since beginning of December, had to go to ER b/c she couldn't breathe, given prednisone, cxr was normal, denies fever chills, states just can't breathe well and is coughing constantly, using albuterol inhaler a lot more than she normally does  O: vitals wnl, nad, ENT wnl, neck supple no lymph, lungs c t a, cv rrr  A: acute asthmatic bronchitis  P: sterapred ds 10mg  12 d dose pack, omnicef, advair hfa, continue albuterol, diflucan if needed, tussionex 115ml nr

## 2015-09-16 ENCOUNTER — Encounter: Payer: Self-pay | Admitting: Physician Assistant

## 2015-09-16 ENCOUNTER — Ambulatory Visit: Payer: Self-pay | Admitting: Physician Assistant

## 2015-09-16 VITALS — BP 110/70 | HR 103 | Temp 98.2°F

## 2015-09-16 DIAGNOSIS — R42 Dizziness and giddiness: Secondary | ICD-10-CM

## 2015-09-16 LAB — POCT URINE PREGNANCY: PREG TEST UR: NEGATIVE

## 2015-09-16 NOTE — Progress Notes (Signed)
S: pt c/o being really tired, hasn't slept in 4 days, feeling dizzy, no fever/chills/ using tussionex and prednisone as rx'd, missed work yesterday due to fatigue  O: vitals wnl, nad, perrl eomi tms dull, throat wnl, neck supple no lymph, lungs c t a, cv rrr, pt turns pale while taking deep breaths, ekg nsr, urine preg neg  A: dizziness, fatigue  P: stop tussionex, make sure prednsione is taken early in the day, use benadryl up to 50mg  to help with sleep, work note given,

## 2015-09-21 ENCOUNTER — Emergency Department: Payer: Worker's Compensation

## 2015-09-21 ENCOUNTER — Encounter: Payer: Self-pay | Admitting: Emergency Medicine

## 2015-09-21 ENCOUNTER — Emergency Department
Admission: EM | Admit: 2015-09-21 | Discharge: 2015-09-21 | Disposition: A | Discharge status: Home / Self Care | Payer: Worker's Compensation | Attending: Emergency Medicine | Admitting: Emergency Medicine

## 2015-09-21 DIAGNOSIS — Y92148 Other place in prison as the place of occurrence of the external cause: Secondary | ICD-10-CM | POA: Diagnosis not present

## 2015-09-21 DIAGNOSIS — S00412A Abrasion of left ear, initial encounter: Secondary | ICD-10-CM | POA: Insufficient documentation

## 2015-09-21 DIAGNOSIS — R609 Edema, unspecified: Secondary | ICD-10-CM

## 2015-09-21 DIAGNOSIS — R52 Pain, unspecified: Secondary | ICD-10-CM

## 2015-09-21 DIAGNOSIS — Y9389 Activity, other specified: Secondary | ICD-10-CM | POA: Insufficient documentation

## 2015-09-21 DIAGNOSIS — Y990 Civilian activity done for income or pay: Secondary | ICD-10-CM | POA: Diagnosis not present

## 2015-09-21 DIAGNOSIS — S7012XA Contusion of left thigh, initial encounter: Secondary | ICD-10-CM | POA: Diagnosis not present

## 2015-09-21 DIAGNOSIS — S8991XA Unspecified injury of right lower leg, initial encounter: Secondary | ICD-10-CM | POA: Diagnosis not present

## 2015-09-21 DIAGNOSIS — S0993XA Unspecified injury of face, initial encounter: Secondary | ICD-10-CM | POA: Diagnosis present

## 2015-09-21 DIAGNOSIS — S0083XA Contusion of other part of head, initial encounter: Secondary | ICD-10-CM | POA: Insufficient documentation

## 2015-09-21 DIAGNOSIS — S0003XA Contusion of scalp, initial encounter: Secondary | ICD-10-CM

## 2015-09-21 DIAGNOSIS — Z23 Encounter for immunization: Secondary | ICD-10-CM | POA: Insufficient documentation

## 2015-09-21 DIAGNOSIS — S0990XA Unspecified injury of head, initial encounter: Secondary | ICD-10-CM | POA: Insufficient documentation

## 2015-09-21 DIAGNOSIS — Z79899 Other long term (current) drug therapy: Secondary | ICD-10-CM | POA: Insufficient documentation

## 2015-09-21 DIAGNOSIS — Z7951 Long term (current) use of inhaled steroids: Secondary | ICD-10-CM | POA: Diagnosis not present

## 2015-09-21 DIAGNOSIS — Z87891 Personal history of nicotine dependence: Secondary | ICD-10-CM | POA: Insufficient documentation

## 2015-09-21 MED ORDER — HYDROCODONE-ACETAMINOPHEN 5-325 MG PO TABS: 1.0000 | ORAL_TABLET | ORAL | Status: DC | PRN

## 2015-09-21 MED ORDER — IBUPROFEN 800 MG PO TABS: 800.0000 mg | ORAL_TABLET | Freq: Three times a day (TID) | ORAL | Status: DC

## 2015-09-21 MED ORDER — TETANUS-DIPHTH-ACELL PERTUSSIS 5-2.5-18.5 LF-MCG/0.5 IM SUSP
0.5000 mL | Freq: Once | INTRAMUSCULAR | Status: AC
  Administered 2015-09-21: 0.5 mL via INTRAMUSCULAR
  Filled 2015-09-21: qty 0.5

## 2015-09-21 NOTE — Discharge Instructions (Signed)
Contusion A contusion is a deep bruise. Contusions happen when an injury causes bleeding under the skin. Symptoms of bruising include pain, swelling, and discolored skin. The skin may turn blue, purple, or yellow. HOME CARE   Rest the injured area.  If told, put ice on the injured area.  Put ice in a plastic bag.  Place a towel between your skin and the bag.  Leave the ice on for 20 minutes, 2-3 times per day.  If told, put light pressure (compression) on the injured area using an elastic bandage. Make sure the bandage is not too tight. Remove it and put it back on as told by your doctor.  If possible, raise (elevate) the injured area above the level of your heart while you are sitting or lying down.  Take over-the-counter and prescription medicines only as told by your doctor. GET HELP IF:  Your symptoms do not get better after several days of treatment.  Your symptoms get worse.  You have trouble moving the injured area. GET HELP RIGHT AWAY IF:   You have very bad pain.  You have a loss of feeling (numbness) in a hand or foot.  Your hand or foot turns pale or cold.   This information is not intended to replace advice given to you by your health care provider. Make sure you discuss any questions you have with your health care provider.   Document Released: 02/16/2008 Document Revised: 05/21/2015 Document Reviewed: 01/15/2015 Elsevier Interactive Patient Education 2016 [employer]   Follow up with a [facility] health [place] [facility] if any continued problems. Ibuprofen 3 times a day with food for pain and inflammation. Norco as needed for severe pain. Use ice to your face as needed for swelling.
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
## 2015-11-28 DIAGNOSIS — Y9389 Activity, other specified: Secondary | ICD-10-CM | POA: Insufficient documentation

## 2015-11-28 DIAGNOSIS — S199XXA Unspecified injury of neck, initial encounter: Secondary | ICD-10-CM | POA: Insufficient documentation

## 2015-11-28 DIAGNOSIS — S3991XA Unspecified injury of abdomen, initial encounter: Secondary | ICD-10-CM | POA: Insufficient documentation

## 2015-11-28 DIAGNOSIS — Z7951 Long term (current) use of inhaled steroids: Secondary | ICD-10-CM | POA: Diagnosis not present

## 2015-11-28 DIAGNOSIS — Z791 Long term (current) use of non-steroidal anti-inflammatories (NSAID): Secondary | ICD-10-CM | POA: Diagnosis not present

## 2015-11-28 DIAGNOSIS — Y998 Other external cause status: Secondary | ICD-10-CM | POA: Insufficient documentation

## 2015-11-28 DIAGNOSIS — Z87891 Personal history of nicotine dependence: Secondary | ICD-10-CM | POA: Diagnosis not present

## 2015-11-28 DIAGNOSIS — S4992XA Unspecified injury of left shoulder and upper arm, initial encounter: Secondary | ICD-10-CM | POA: Diagnosis not present

## 2015-11-28 DIAGNOSIS — Z3202 Encounter for pregnancy test, result negative: Secondary | ICD-10-CM | POA: Insufficient documentation

## 2015-11-28 DIAGNOSIS — T148 Other injury of unspecified body region: Secondary | ICD-10-CM | POA: Diagnosis not present

## 2015-11-28 DIAGNOSIS — S4991XA Unspecified injury of right shoulder and upper arm, initial encounter: Secondary | ICD-10-CM | POA: Insufficient documentation

## 2015-11-28 DIAGNOSIS — Y92410 Unspecified street and highway as the place of occurrence of the external cause: Secondary | ICD-10-CM | POA: Diagnosis not present
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
## 2015-11-29 ENCOUNTER — Emergency Department: Payer: Managed Care, Other (non HMO)

## 2015-11-29 ENCOUNTER — Emergency Department
Admission: EM | Admit: 2015-11-29 | Discharge: 2015-11-29 | Disposition: A | Discharge status: Home / Self Care | Payer: Managed Care, Other (non HMO) | Attending: Emergency Medicine | Admitting: Emergency Medicine

## 2015-11-29 DIAGNOSIS — S199XXA Unspecified injury of neck, initial encounter: Secondary | ICD-10-CM | POA: Diagnosis not present

## 2015-11-29 DIAGNOSIS — T07XXXA Unspecified multiple injuries, initial encounter: Secondary | ICD-10-CM

## 2015-11-29 LAB — URINALYSIS COMPLETE WITH MICROSCOPIC (ARMC ONLY)
Bilirubin Urine: NEGATIVE
GLUCOSE, UA: NEGATIVE mg/dL
Ketones, ur: NEGATIVE mg/dL
LEUKOCYTES UA: NEGATIVE
Nitrite: NEGATIVE
Protein, ur: NEGATIVE mg/dL
Specific Gravity, Urine: 1.018 (ref 1.005–1.030)
pH: 5 (ref 5.0–8.0)

## 2015-11-29 LAB — PREGNANCY, URINE: PREG TEST UR: NEGATIVE

## 2015-11-29 LAB — POCT PREGNANCY, URINE: PREG TEST UR: NEGATIVE

## 2015-11-29 MED ORDER — IBUPROFEN 800 MG PO TABS
ORAL_TABLET | ORAL | Status: AC
  Administered 2015-11-29: 800 mg via ORAL
  Filled 2015-11-29: qty 1

## 2015-11-29 MED ORDER — IBUPROFEN 800 MG PO TABS
800.0000 mg | ORAL_TABLET | Freq: Once | ORAL | Status: AC
  Administered 2015-11-29: 800 mg via ORAL

## 2015-11-29 NOTE — ED Notes (Signed)
[employer] interviewing pt
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
## 2015-11-29 NOTE — ED Notes (Signed)
[employer] still interviewing pt
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
## 2015-11-29 NOTE — ED Notes (Signed)
Patient transported to X-ray 
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
## 2016-06-14 ENCOUNTER — Ambulatory Visit: Payer: Self-pay | Admitting: Physician Assistant

## 2016-06-14 ENCOUNTER — Encounter: Payer: Self-pay | Admitting: Physician Assistant

## 2016-06-14 VITALS — BP 120/80 | HR 69 | Temp 97.8°F

## 2016-06-14 DIAGNOSIS — J209 Acute bronchitis, unspecified: Secondary | ICD-10-CM

## 2016-06-14 MED ORDER — IPRATROPIUM-ALBUTEROL 0.5-2.5 (3) MG/3ML IN SOLN
3.0000 mL | Freq: Once | RESPIRATORY_TRACT | Status: AC
  Administered 2016-06-14: 3 mL via RESPIRATORY_TRACT

## 2016-06-14 MED ORDER — METHYLPREDNISOLONE 4 MG PO TBPK: ORAL_TABLET | ORAL | 0 refills | Status: DC

## 2016-06-14 MED ORDER — IPRATROPIUM-ALBUTEROL 0.5-2.5 (3) MG/3ML IN SOLN: 3.0000 mL | RESPIRATORY_TRACT | 6 refills | Status: AC | PRN

## 2016-06-14 MED ORDER — CEFDINIR 300 MG PO CAPS: 300.0000 mg | ORAL_CAPSULE | Freq: Two times a day (BID) | ORAL | 0 refills | Status: DC

## 2016-06-14 NOTE — Progress Notes (Signed)
S: C/o cough 7 days, no fever, chills, cp/sob, v/d; mucus was green this am but clear throughout the day, cough is sporadic, mostly dry, ribs hurt from coughing, has neb machine at home but no nebules, using inhaler, former smoker  Using otc meds:   O: PE: vitals wnl, nad, perrl eomi, normocephalic, tms dull, nasal mucosa red and swollen, throat injected, neck supple no lymph, lungs with crackling left lower lung, cv rrr, neuro intact, svn duoneb given, lungs c t a  A:  Acute bronchitis   P: drink fluids, continue regular meds , use otc meds of choice, return if not improving in 5 days, return earlier if worsening , omnicef, medrol dose pack, duoneb nebules

## 2016-06-21 ENCOUNTER — Emergency Department: Payer: Managed Care, Other (non HMO)

## 2016-06-21 ENCOUNTER — Encounter: Payer: Self-pay | Admitting: Emergency Medicine

## 2016-06-21 ENCOUNTER — Emergency Department
Admission: EM | Admit: 2016-06-21 | Discharge: 2016-06-21 | Disposition: A | Discharge status: Home / Self Care | Payer: Managed Care, Other (non HMO) | Attending: Emergency Medicine | Admitting: Emergency Medicine

## 2016-06-21 DIAGNOSIS — Y929 Unspecified place or not applicable: Secondary | ICD-10-CM | POA: Diagnosis not present

## 2016-06-21 DIAGNOSIS — Z791 Long term (current) use of non-steroidal anti-inflammatories (NSAID): Secondary | ICD-10-CM | POA: Insufficient documentation

## 2016-06-21 DIAGNOSIS — X501XXA Overexertion from prolonged static or awkward postures, initial encounter: Secondary | ICD-10-CM | POA: Insufficient documentation

## 2016-06-21 DIAGNOSIS — S161XXA Strain of muscle, fascia and tendon at neck level, initial encounter: Secondary | ICD-10-CM | POA: Diagnosis not present

## 2016-06-21 DIAGNOSIS — Y999 Unspecified external cause status: Secondary | ICD-10-CM | POA: Diagnosis not present

## 2016-06-21 DIAGNOSIS — Z79899 Other long term (current) drug therapy: Secondary | ICD-10-CM | POA: Insufficient documentation

## 2016-06-21 DIAGNOSIS — S169XXA Unspecified injury of muscle, fascia and tendon at neck level, initial encounter: Secondary | ICD-10-CM | POA: Diagnosis present

## 2016-06-21 DIAGNOSIS — Z87891 Personal history of nicotine dependence: Secondary | ICD-10-CM | POA: Insufficient documentation

## 2016-06-21 DIAGNOSIS — G43809 Other migraine, not intractable, without status migrainosus: Secondary | ICD-10-CM | POA: Insufficient documentation

## 2016-06-21 DIAGNOSIS — Y939 Activity, unspecified: Secondary | ICD-10-CM | POA: Diagnosis not present

## 2016-06-21 DIAGNOSIS — G43109 Migraine with aura, not intractable, without status migrainosus: Secondary | ICD-10-CM

## 2016-06-21 LAB — URINALYSIS COMPLETE WITH MICROSCOPIC (ARMC ONLY)
BILIRUBIN URINE: NEGATIVE
GLUCOSE, UA: 50 mg/dL — AB
LEUKOCYTES UA: NEGATIVE
Nitrite: NEGATIVE
PH: 5 (ref 5.0–8.0)
Protein, ur: NEGATIVE mg/dL
Specific Gravity, Urine: 1.02 (ref 1.005–1.030)

## 2016-06-21 LAB — BASIC METABOLIC PANEL
ANION GAP: 7 (ref 5–15)
BUN: 13 mg/dL (ref 6–20)
CALCIUM: 9.3 mg/dL (ref 8.9–10.3)
CO2: 23 mmol/L (ref 22–32)
Chloride: 105 mmol/L (ref 101–111)
Creatinine, Ser: 0.87 mg/dL (ref 0.44–1.00)
GFR calc Af Amer: >60 mL/min (ref >60)
Glucose, Bld: 98 mg/dL (ref 65–99)
POTASSIUM: 3.8 mmol/L (ref 3.5–5.1)
Sodium: 135 mmol/L (ref 135–145)

## 2016-06-21 LAB — CBC
HCT: 37.2 % (ref 35.0–47.0)
HEMOGLOBIN: 13.2 g/dL (ref 12.0–16.0)
MCH: 32.1 pg (ref 26.0–34.0)
MCHC: 35.6 g/dL (ref 32.0–36.0)
MCV: 90.2 fL (ref 80.0–100.0)
Platelets: 311 10*3/uL (ref 150–440)
RBC: 4.12 MIL/uL (ref 3.80–5.20)
RDW: 13.1 % (ref 11.5–14.5)
WBC: 11.4 10*3/uL — ABNORMAL HIGH (ref 3.6–11.0)

## 2016-06-21 LAB — POCT PREGNANCY, URINE: Preg Test, Ur: NEGATIVE

## 2016-06-21 MED ORDER — IBUPROFEN 800 MG PO TABS: 800.0000 mg | ORAL_TABLET | Freq: Three times a day (TID) | ORAL | 0 refills | Status: DC | PRN

## 2016-06-21 MED ORDER — DIAZEPAM 5 MG PO TABS: 5.0000 mg | ORAL_TABLET | Freq: Three times a day (TID) | ORAL | 0 refills | Status: AC | PRN

## 2016-06-21 MED ORDER — IBUPROFEN 800 MG PO TABS
800.0000 mg | ORAL_TABLET | Freq: Once | ORAL | Status: AC
  Administered 2016-06-21: 800 mg via ORAL
  Filled 2016-06-21: qty 1

## 2016-06-21 NOTE — ED Triage Notes (Addendum)
Pt presents to ED with reports of neck pain that started yesterday.Pt states she felt a pop in her neck and then the pain began. Pt reports new onset blurred vision that began approximately one hour ago. Pt states if looks like she is looking through a kaleidoscope. Pt reports visual changes in both eyes. Pt states her peripheral vision is distorted. Pt denies wearing glasses or contacts. Pt denies any injury to eyes. Pt reports she has recently been treated for bronchitis.

## 2016-06-21 NOTE — ED Provider Notes (Signed)
[facility] Emergency Department Provider Note        Time seen:----------------------------------------- 5:01 PM on 06/21/2016 -----------------------------------------    I have reviewed the triage vital signs and the nursing notes.   HISTORY  Chief Complaint Blurred Vision and Neck Pain    HPI Linda Benitez is a 30 y.o. female who presents to the ER for vision changes today. Patient states her vision felt blurry like she was looking through a kaleidoscope. She reports vision changes in both eyes, peripheral vision was distorted. She has not had this happen before, recently has had bronchitis and feels like her cough has not gone better. She also felt a pop in her neck when she was coughing that started yesterday. The neck pain persists today.   History reviewed. No pertinent past medical history.  There are no active problems to display for this patient.   Past Surgical History:  Procedure Laterality Date  . MANDIBLE FRACTURE SURGERY      Allergies Flexeril [cyclobenzaprine] and Norco [hydrocodone-acetaminophen]  Social History Social History  Substance Use Topics  . Smoking status: Former Games developermoker  . Smokeless tobacco: Not on file  . Alcohol use No    Review of Systems Constitutional: Negative for fever. ENT: Positive for vision changes Cardiovascular: Negative for chest pain. Respiratory: Positive for cough Gastrointestinal: Negative for abdominal pain, vomiting and diarrhea. Genitourinary: Negative for dysuria. Musculoskeletal: Positive for left-sided neck pain Skin: Negative for rash. Neurological: Negative for headaches, focal weakness or numbness.  10-point ROS otherwise negative.  ____________________________________________   PHYSICAL EXAM:  VITAL SIGNS: ED Triage Vitals [06/21/16 1441]  Enc Vitals Group     BP (!) 156/100     Pulse Rate 94     Resp 18     Temp 97.8 F (36.6 C)     Temp Source Oral   SpO2 97 %     Weight 190 lb (86.2 kg)     Height 5\' 4"  (1.626 m)     Head Circumference      Peak Flow      Pain Score 5     Pain Loc      Pain Edu?      Excl. in GC?     Constitutional: Alert and oriented. Well appearing and in no distress. Eyes: Conjunctivae are normal. PERRL. Normal extraocular movements. ENT   Head: Normocephalic and atraumatic.   Nose: No congestion/rhinnorhea.   Mouth/Throat: Mucous membranes are moist.   Neck: No stridor. Cardiovascular: Normal rate, regular rhythm. No murmurs, rubs, or gallops. Respiratory: Normal respiratory effort without tachypnea nor retractions. Breath sounds are clear and equal bilaterally. No wheezes/rales/rhonchi. Gastrointestinal: Soft and nontender. Normal bowel sounds Musculoskeletal: Nontender with normal range of motion in all extremities. No lower extremity tenderness nor edema. Left-sided paraspinous and trapezius muscle tenderness. Neurologic:  Normal speech and language. No gross focal neurologic deficits are appreciated. Cranial nerves, strength and sensation appear normal. Skin:  Skin is warm, dry and intact. No rash noted. Psychiatric: Mood and affect are normal. Speech and behavior are normal.  ____________________________________________  ED COURSE:  Pertinent labs & imaging results that were available during my care of the patient were reviewed by me and considered in my medical decision making (see chart for details). Clinical Course  Patient is in no distress, we will assess with basic labs and consider imaging.  Procedures ____________________________________________   LABS (pertinent positives/negatives)  Labs Reviewed  CBC - Abnormal; Notable for the following:  Result Value   WBC 11.4 (*)    All other components within normal limits  URINALYSIS COMPLETEWITH MICROSCOPIC ([facility] ONLY) - Abnormal; Notable for the following:    Color, Urine YELLOW (*)    APPearance HAZY (*)    Glucose, UA  50 (*)    Ketones, ur TRACE (*)    Hgb urine dipstick 1+ (*)    Bacteria, UA RARE (*)    Squamous Epithelial / LPF 6-30 (*)    All other components within normal limits  BASIC METABOLIC PANEL  POCT PREGNANCY, URINE    RADIOLOGY Images were viewed by me  Chest x-ray IMPRESSION: No acute cardiopulmonary disease.   ____________________________________________  FINAL ASSESSMENT AND PLAN  Blurry vision, cervical strain  Plan: Patient with labs and imaging as dictated above. 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 Neck pain seems to be musculoskeletal. She has normal neurologic exam with unremarkable visual acuity in both eyes at this time. Case was discussed with Dr. Inez Pilgrim who states he is happy to follow up with her some outpatient. I will prescribe Motrin and Valium for her to take for neck pain. She is stable for discharge at this time.   Emily Filbert, MD   Note: This dictation was prepared with Dragon dictation. Any transcriptional errors that result from this process are unintentional    Emily Filbert, MD 06/21/16 1820

## 2018-08-24 ENCOUNTER — Encounter: Payer: Self-pay | Admitting: Emergency Medicine

## 2018-08-24 ENCOUNTER — Emergency Department
Admission: EM | Admit: 2018-08-24 | Discharge: 2018-08-24 | Disposition: A | Discharge status: Home / Self Care | Payer: PRIVATE HEALTH INSURANCE | Attending: Emergency Medicine | Admitting: Emergency Medicine

## 2018-08-24 DIAGNOSIS — L278 Dermatitis due to other substances taken internally: Secondary | ICD-10-CM | POA: Insufficient documentation

## 2018-08-24 DIAGNOSIS — T360X5A Adverse effect of penicillins, initial encounter: Secondary | ICD-10-CM | POA: Insufficient documentation

## 2018-08-24 DIAGNOSIS — R21 Rash and other nonspecific skin eruption: Secondary | ICD-10-CM | POA: Insufficient documentation

## 2018-08-24 DIAGNOSIS — Z79899 Other long term (current) drug therapy: Secondary | ICD-10-CM | POA: Insufficient documentation

## 2018-08-24 DIAGNOSIS — L270 Generalized skin eruption due to drugs and medicaments taken internally: Secondary | ICD-10-CM

## 2018-08-24 LAB — GROUP A STREP BY PCR: Group A Strep by PCR: NOT DETECTED

## 2018-08-24 MED ORDER — FAMOTIDINE 20 MG PO TABS: 20.0000 mg | ORAL_TABLET | Freq: Two times a day (BID) | ORAL | 0 refills | Status: AC

## 2018-08-24 MED ORDER — FAMOTIDINE 20 MG PO TABS
20.0000 mg | ORAL_TABLET | Freq: Once | ORAL | Status: AC
  Administered 2018-08-24: 20 mg via ORAL
  Filled 2018-08-24: qty 1

## 2018-08-24 MED ORDER — DEXAMETHASONE SODIUM PHOSPHATE 10 MG/ML IJ SOLN
10.0000 mg | Freq: Once | INTRAMUSCULAR | Status: AC
  Administered 2018-08-24: 10 mg via INTRAMUSCULAR
  Filled 2018-08-24: qty 1

## 2018-08-24 MED ORDER — PREDNISONE 10 MG PO TABS: ORAL_TABLET | ORAL | 0 refills | Status: AC

## 2018-08-24 NOTE — ED Notes (Signed)
See triage note   States she was placed on Bactrim DS for possible MRSA infection  States she felt bad but did not have rash  Informed her PCP and she was then placed on Augmentin  Woke up with generalized rash  And feels like throat is scratchy

## 2018-08-24 NOTE — Discharge Instructions (Addendum)
Follow-up with your primary care provider if any continued problems.  Return to the emergency department if any severe worsening of your symptoms.  Discontinue taking Augmentin.  You may continue taking Benadryl as needed for itching.  Also Pepcid is a antihistamine and should reduce itching without drowsiness.  This is the medicine that we spoke of that may be cheaper to buy prescription rather than over-the-counter.  Asked the pharmacist.  Also a good Rx card was placed with your papers.  Prednisone is also once a day for the next 4 days.

## 2018-08-24 NOTE — ED Provider Notes (Signed)
[facility] Emergency Department Provider Note  ____________________________________________   None    (approximate)  I have reviewed the triage vital signs and the nursing notes.   HISTORY  Chief Complaint Allergic Reaction and Rash   HPI Linda Benitez is a 32 y.o. female presents to the ED with complaint of rash.  Patient states that she was placed on Bactrim for possible MRSA infection.  She states that she felt bad and lethargic while taking the antibiotic.  Her PCP discontinued her Bactrim DS and started her on Augmentin.  Patient states she took her first Augmentin 8 PM last evening.  She states she woke up this morning with a scratchy throat and a generalized rash that itched.  She denies any previous penicillin allergies.  At this time she denies any respiratory difficulty but states that her throat does hurt.  History reviewed. No pertinent past medical history.  There are no active problems to display for this patient.   Past Surgical History:  Procedure Laterality Date  . MANDIBLE FRACTURE SURGERY      Prior to Admission medications   Medication Sig Start Date End Date Taking? Authorizing Provider  diazepam (VALIUM) 5 MG tablet Take 1 tablet (5 mg total) by mouth every 8 (eight) hours as needed for muscle spasms. 06/21/16   Emily FilbertWilliams, Jonathan E, MD  famotidine (PEPCID) 20 MG tablet Take 1 tablet (20 mg total) by mouth 2 (two) times daily. 08/24/18 08/24/19  Tommi RumpsSummers, Rhonda L, PA-C  fluticasone-salmeterol (ADVAIR HFA) [phone] MCG/ACT inhaler Inhale 2 puffs into the lungs 2 (two) times daily. Patient not taking: Reported on 06/14/2016 09/09/15   Faythe GheeFisher, Susan W, PA-C  ipratropium-albuterol (DUONEB) 0.5-2.5 (3) MG/3ML SOLN Take 3 mLs by nebulization every 4 (four) hours as needed. 06/14/16   Sherrie MustacheFisher, Roselyn BeringSusan W, PA-C  levonorgestrel-ethinyl estradiol (AVIANE,ALESSE,LESSINA) 0.1-20 MG-MCG tablet Take by mouth.    [provider]  predniSONE  (DELTASONE) 10 MG tablet Take 3 tablets once a day for 4 days 08/24/18   Tommi RumpsSummers, Rhonda L, PA-C  venlafaxine XR (EFFEXOR XR) 75 MG 24 hr capsule Take 1 capsule (75 mg total) by mouth daily with breakfast. Patient not taking: Reported on 06/14/2016 06/10/15   Faythe GheeFisher, Susan W, PA-C    Allergies Flexeril [cyclobenzaprine]; Norco [hydrocodone-acetaminophen]; Bactrim [sulfamethoxazole-trimethoprim]; and Augmentin [amoxicillin-pot clavulanate]  No family history on file.  Social History Social History   Tobacco Use  . Smoking status: Former Smoker  Substance Use Topics  . Alcohol use: No    Alcohol/week: 0.0 standard drinks  . Drug use: Not on file    Review of Systems Constitutional: No fever/chills Eyes: No visual changes. ENT: Positive sore throat.  Negative ear pain. Cardiovascular: Denies chest pain. Respiratory: Denies shortness of breath. Gastrointestinal: No abdominal pain.  No nausea, no vomiting.   Musculoskeletal: Negative for back pain. Skin: Negative for rash. Neurological: Negative for headaches, focal weakness or numbness. ____________________________________________   PHYSICAL EXAM:  VITAL SIGNS: ED Triage Vitals  Enc Vitals Group     BP 08/24/18 1214 127/87     Pulse Rate 08/24/18 1214 77     Resp 08/24/18 1214 20     Temp 08/24/18 1214 98.4 F (36.9 C)     Temp Source 08/24/18 1214 Oral     SpO2 08/24/18 1214 99 %     Weight 08/24/18 1215 175 lb (79.4 kg)     Height 08/24/18 1215 5\' 4"  (1.626 m)     Head Circumference --  Peak Flow --      Pain Score 08/24/18 1210 0     Pain Loc --      Pain Edu? --      Excl. in GC? --    Constitutional: Alert and oriented. Well appearing and in no acute distress. Eyes: Conjunctivae are normal.  Head: Atraumatic. Nose: No congestion/rhinnorhea. Mouth/Throat: Mucous membranes are moist.  Oropharynx non-erythematous.  No tonsillar edema or exudate is present.  No edema present. Neck: No stridor.     Hematological/Lymphatic/Immunilogical: No cervical lymphadenopathy. Cardiovascular: Normal rate, regular rhythm. Grossly normal heart sounds.  Good peripheral circulation. Respiratory: Normal respiratory effort.  No retractions. Lungs CTAB.  No wheezes or respiratory difficulty is noted.  Patient is able to talk in complete sentences without any difficulty. Gastrointestinal: Soft and nontender. No distention.  Musculoskeletal: Moves upper and lower extremities without any difficulty.  No joint edema or tenderness is present.  Patient is able to ambulate without any assistance. Neurologic:  Normal speech and language. No gross focal neurologic deficits are appreciated. No gait instability. Skin:  Skin is warm, dry and intact.  There is a generalized erythematous rash over the trunk, upper and lower extremities.  Rash is papular with some linear areas. Psychiatric: Mood and affect are normal. Speech and behavior are normal.  ____________________________________________   LABS (all labs ordered are listed, but only abnormal results are displayed)  Labs Reviewed  GROUP A STREP BY PCR     PROCEDURES  Procedure(s) performed: None  Procedures  Critical Care performed: No  ____________________________________________   INITIAL IMPRESSION / ASSESSMENT AND PLAN / ED COURSE  As part of my medical decision making, I reviewed the following data within the electronic MEDICAL RECORD NUMBER Notes from prior ED visits and [place] Controlled Substance Database  Patient presents to the ED with complaint of possible allergic reaction to Augmentin.  Patient states that she recently was on Bactrim DS and that was discontinued due to being lethargic.  She was placed on Augmentin by her PCP and took the first dose at 8 PM last evening and woke with an itchy rash over her body this morning.  Patient also complained of a sore throat.  Patient works in [employer] and is uncertain as to whether she has been exposed to  strep throat.  She denies any difficulty breathing and is able to talk in complete sentences.  Patient is covered with an erythematous rash.  Strep test was negative and reassuring.  Patient improved with a course of Decadron 10 mg IM and Pepcid 20 mg p.o.  Patient was discharged to continue with prednisone and Pepcid.  She is aware that she can take Benadryl with these medications for itching if needed.  She has already discontinued taking the Augmentin and will contact her PCP on Monday for further instructions.  She is aware that she can return to the emergency department if any severe worsening of her symptoms.  ____________________________________________   FINAL CLINICAL IMPRESSION(S) / ED DIAGNOSES  Final diagnoses:  Allergic drug rash     ED Discharge Orders         Ordered    predniSONE (DELTASONE) 10 MG tablet     08/24/18 1352    famotidine (PEPCID) 20 MG tablet  2 times daily     08/24/18 1352           Note:  This document was prepared using Dragon voice recognition software and may include unintentional dictation errors.  Tommi Rumps, PA-C 08/24/18 1358    Schaevitz, Myra Rude, MD 08/24/18 [phone]

## 2018-08-24 NOTE — ED Triage Notes (Signed)
Pt reports was given a sulfa abx a week ago (bactrim) for an abscess an she had a reaction to it so she had to switch her abx.  Pt reports she was given Augmentin yesterday and she took her first does last pm and today woke up with a rash all over her body. Pt tearful in triage.

## 2021-02-18 ENCOUNTER — Other Ambulatory Visit: Payer: Self-pay

## 2021-02-18 ENCOUNTER — Other Ambulatory Visit: Payer: Self-pay | Admitting: Family Medicine

## 2021-02-18 ENCOUNTER — Ambulatory Visit
Admission: RE | Admit: 2021-02-18 | Discharge: 2021-02-18 | Disposition: A | Discharge status: Home / Self Care | Payer: BC Managed Care – PPO | Source: Ambulatory Visit | Attending: Family Medicine | Admitting: Family Medicine

## 2021-02-18 DIAGNOSIS — R059 Cough, unspecified: Secondary | ICD-10-CM

## 2021-07-03 ENCOUNTER — Inpatient Hospital Stay (HOSPITAL_COMMUNITY)
Admission: AD | Admit: 2021-07-03 | Payer: BC Managed Care – PPO | Source: Home / Self Care | Admitting: Obstetrics and Gynecology

## 2021-07-03 ENCOUNTER — Inpatient Hospital Stay (HOSPITAL_COMMUNITY): Payer: BC Managed Care – PPO

## 2021-09-04 ENCOUNTER — Other Ambulatory Visit (HOSPITAL_COMMUNITY)
Admission: RE | Admit: 2021-09-04 | Discharge: 2021-09-04 | Disposition: A | Discharge status: Home / Self Care | Payer: BC Managed Care – PPO | Source: Ambulatory Visit | Attending: Obstetrics and Gynecology | Admitting: Obstetrics and Gynecology

## 2021-09-04 ENCOUNTER — Ambulatory Visit (INDEPENDENT_AMBULATORY_CARE_PROVIDER_SITE_OTHER): Payer: BC Managed Care – PPO | Admitting: Obstetrics and Gynecology

## 2021-09-04 ENCOUNTER — Encounter: Payer: Self-pay | Admitting: Obstetrics and Gynecology

## 2021-09-04 ENCOUNTER — Other Ambulatory Visit: Payer: Self-pay

## 2021-09-04 VITALS — BP 136/74 | Ht 64.0 in | Wt 215.8 lb

## 2021-09-04 DIAGNOSIS — Z3009 Encounter for other general counseling and advice on contraception: Secondary | ICD-10-CM

## 2021-09-04 DIAGNOSIS — Z124 Encounter for screening for malignant neoplasm of cervix: Secondary | ICD-10-CM | POA: Insufficient documentation

## 2021-09-04 NOTE — H&P (View-Only) (Signed)
Patient ID: Linda Benitez, female   DOB: Jul 23, 1986, 35 y.o.   MRN: [id number]  Reason for Consult: Gynecologic Exam   Referred by Dortha Kern, MD  Subjective:     HPI:  Linda Benitez is a 35 y.o. female she presents today for sterilization consult.  She reports that she has been taking birth control pills since 2007.  She was recently diagnosed with hypertension and is a active smoker and is in need of transitioning off of her estrogen progesterone birth control pills.  She would like to undergo sterilization procedure.  She has never had children although she reports that she has raised 2 stepchildren since they were infants and does not desire to start over.  She feels fulfilled as a parent and has raised her step children as her own. She does not desire future pregnancy.   Gynecological History  Patient's last menstrual period was 08/12/2021. Menarche: [age]  She denies passage of large clots She denies sensations of gushing or flooding of blood. She denies accidents where she bleeds through her clothing. She denies that she changes a saturated pad or tampon more frequently than every hour.  She denies that pain from her periods limits her activities.  History of fibroids, polyps, or ovarian cysts? : no  History of PCOS? no Hstory of Endometriosis? no History of abnormal pap smears? no Have you had any sexually transmitted infections in the past? no  She denies HPV vaccination in the past.   Last NWG:NFAOZHY  She identifies as a female. She is sexually active with men.   She denies dyspareunia. She denies postcoital bleeding.  She currently uses OCP (estrogen/progesterone) for contraception.   Obstetrical History G0P0  No past medical history on file. No family history on file. Past Surgical History:  Procedure Laterality Date   MANDIBLE FRACTURE SURGERY      Short Social History:  Social History   Tobacco Use   Smoking status: Former   Smokeless  tobacco: Not on file  Substance Use Topics   Alcohol use: No    Alcohol/week: 0.0 standard drinks    Allergies  Allergen Reactions   Flexeril [Cyclobenzaprine] Hives    On jaw and neck after taking norco and flexeril   Norco [Hydrocodone-Acetaminophen] Hives    On jaw and neck after taking norco and flexeril   Bactrim [Sulfamethoxazole-Trimethoprim] Other (See Comments)    Adverse reaciton, per pt MD stated it was havinga  Reverse effect   Augmentin [Amoxicillin-Pot Clavulanate] Rash    Current Outpatient Medications  Medication Sig Dispense Refill   amLODipine (NORVASC) 5 MG tablet Take 5 mg by mouth daily.     escitalopram (LEXAPRO) 20 MG tablet Take 20 mg by mouth daily.     levonorgestrel-ethinyl estradiol (AVIANE,ALESSE,LESSINA) 0.1-20 MG-MCG tablet Take by mouth.     predniSONE (DELTASONE) 10 MG tablet Take 3 tablets once a day for 4 days 12 tablet 0   diazepam (VALIUM) 5 MG tablet Take 1 tablet (5 mg total) by mouth every 8 (eight) hours as needed for muscle spasms. (Patient not taking: Reported on 09/04/2021) 20 tablet 0   famotidine (PEPCID) 20 MG tablet Take 1 tablet (20 mg total) by mouth 2 (two) times daily. 14 tablet 0   fluticasone furoate-vilanterol (BREO ELLIPTA) 200-25 MCG/ACT AEPB Inhale into the lungs. (Patient not taking: Reported on 09/04/2021)     fluticasone-salmeterol (ADVAIR HFA) 115-21 MCG/ACT inhaler Inhale 2 puffs into the lungs 2 (two) times daily. (Patient not  taking: Reported on 06/14/2016) 1 Inhaler 12   ipratropium-albuterol (DUONEB) 0.5-2.5 (3) MG/3ML SOLN Take 3 mLs by nebulization every 4 (four) hours as needed. (Patient not taking: Reported on 09/04/2021) 360 mL 6   venlafaxine XR (EFFEXOR XR) 75 MG 24 hr capsule Take 1 capsule (75 mg total) by mouth daily with breakfast. (Patient not taking: Reported on 06/14/2016) 30 capsule 4   No current facility-administered medications for this visit.    Review of Systems  Constitutional: Negative for chills,  fatigue, fever and unexpected weight change.  HENT: Negative for trouble swallowing.  Eyes: Negative for loss of vision.  Respiratory: Negative for cough, shortness of breath and wheezing.  Cardiovascular: Negative for chest pain, leg swelling, palpitations and syncope.  GI: Negative for abdominal pain, blood in stool, diarrhea, nausea and vomiting.  GU: Negative for difficulty urinating, dysuria, frequency and hematuria.  Musculoskeletal: Negative for back pain, leg pain and joint pain.  Skin: Negative for rash.  Neurological: Negative for dizziness, headaches, light-headedness, numbness and seizures.  Psychiatric: Negative for behavioral problem, confusion, depressed mood and sleep disturbance.       Objective:  Objective   Vitals:   09/04/21 0828  BP: 136/74  Weight: 215 lb 12.8 oz (97.9 kg)  Height: 5\' 4"  (1.626 m)   Body mass index is 37.04 kg/m.  Physical Exam Vitals and nursing note reviewed. Exam conducted with a chaperone present.  Constitutional:      Appearance: Normal appearance. She is well-developed.  HENT:     Head: Normocephalic and atraumatic.  Eyes:     Extraocular Movements: Extraocular movements intact.     Pupils: Pupils are equal, round, and reactive to light.  Cardiovascular:     Rate and Rhythm: Normal rate and regular rhythm.  Pulmonary:     Effort: Pulmonary effort is normal. No respiratory distress.     Breath sounds: Normal breath sounds.  Abdominal:     General: Abdomen is flat.     Palpations: Abdomen is soft.  Genitourinary:    Comments: External: Normal appearing vulva. No lesions noted.  Speculum examination: Normal appearing cervix. No blood in the vaginal vault. No discharge.   Musculoskeletal:        General: No signs of injury.  Skin:    General: Skin is warm and dry.  Neurological:     Mental Status: She is alert and oriented to person, place, and time.  Psychiatric:        Behavior: Behavior normal.        Thought Content:  Thought content normal.        Judgment: Judgment normal.    Assessment/Plan:     35 y.o. No obstetric history on file.  Patient reports that she desires sterilization. She feels strongly that she does not desire children in the future.   We discussed alternative options for sterilization including long-acting reversible contraception methods. We discussed that sterilization procedures have a failure rate of approximately 1 in  1000.  We discussed potential surgical complications of sterile sterilization including infection damage to surrounding pelvic tissues and risk of bleeding.We discussed that sterilization procedure should be considered on reversible.  Having a reversal surgery to correct a tubal ligation is expensive, risks and ectopic pregnancy, and has high failure rates.  We discussed options of performing a tubal ligation including removing a segment of the fallopian tube or removing an entire fallopian tube.  We discussed that removal of entire fallopian tube has been associated with a  reduction in ovarian cancer risk, however this reduction in is small in her lifetime risk of ovarian cancer is approximately 1 in 100.   Patient feels sure of her decision to have a sterilization procedure.  She desires to have a laparoscopic bilateral salpingectomy.  Note sent to surgical scheduler O who will reach out to the patient to schedule surgery.  Pap smear today.   More than 30 minutes were spent face to face with the patient in the room, reviewing the medical record, labs and images, and coordinating care for the patient. The plan of management was discussed in detail and counseling was provided.     Adrian Prows MD [facility], [facility] 09/04/2021 8:54 AM

## 2021-09-04 NOTE — Patient Instructions (Signed)
Salpingectomy      Salpingectomy, also called tubectomy, is the surgical removal of one of the fallopian tubes. The fallopian tubes allow eggs to travel from the ovaries to the uterus. Removing one fallopian tube does not prevent pregnancy. It also does not cause problems with your menstrual periods. You may need this procedure if you:  Have an ectopic pregnancy. This is when a fertilized egg attaches to the fallopian tube instead of the uterus. An ectopic pregnancy can cause the tube to burst or tear (rupture).  Have an infected fallopian tube.  Have cancer of the fallopian tube or nearby organs.  Have had an ovary removed due to a cyst or tumor.  Have had your uterus removed.  Are at high risk for ovarian cancer.  There are three different methods that can be used for a salpingectomy:  An open method in which one large incision is made in your abdomen.  A laparoscopic method in which a thin, lighted tube with a tiny camera (laparoscope) is used to help perform the procedure. The laparoscope allows a surgeon to make several small incisions in the abdomen instead of one large incision.  A robot-assisted method in which a computer is used to control surgical instruments that are attached to robotic arms.  Tell a health care provider about:  Any allergies you have.  All medicines you are taking, including vitamins, herbs, eye drops, creams, and over-the-counter medicines.  Any problems you or family members have had with anesthetic medicines.  Any blood disorders you have.  Any surgeries you have had.  Any medical conditions you have.  Whether you are pregnant or may be pregnant.  What are the risks?  Generally, this is a safe procedure. However, problems may occur, including:  Infection.  Bleeding.  Allergic reactions to medicines.  Blood clots in the legs or lungs.  Damage to nearby structures or organs.  What happens before the procedure?  Staying hydrated  Follow instructions from your health care provider about  hydration, which may include:  Up to 2 hours before the procedure - you may continue to drink clear liquids, such as water, clear fruit juice, black coffee, and plain tea.  Eating and drinking restrictions  Follow instructions from your health care provider about eating and drinking, which may include:  8 hours before the procedure - stop eating heavy meals or foods, such as meat, fried foods, or fatty foods.  6 hours before the procedure - stop eating light meals or foods, such as toast or cereal.  6 hours before the procedure - stop drinking milk or drinks that contain milk.  2 hours before the procedure - stop drinking clear liquids.  Medicines  Ask your health care provider about:  Changing or stopping your regular medicines. This is especially important if you are taking diabetes medicines or blood thinners.  Taking medicines such as aspirin and ibuprofen. These medicines can thin your blood. Do not take these medicines unless your health care provider tells you to take them.  Taking over-the-counter medicines, vitamins, herbs, and supplements.  General instructions  Do not use any products that contain nicotine or tobacco for at least 4 weeks before the procedure. These products include cigarettes, chewing tobacco, and vaping devices, such as e-cigarettes. If you need help quitting, ask your health care provider.  You may have an exam or tests, such as an electrocardiogram (ECG) or a blood or urine test.  Ask your health care provider:  How your surgery   site will be marked.  What steps will be taken to help prevent infection. These steps may include:  Removing hair at the surgery site.  Washing skin with a germ-killing soap.  Taking antibiotic medicine.  Plan to have a responsible adult take you home from the hospital or clinic.  If you will be going home right after the procedure, plan to have a responsible adult care for you for the time you are told. This is important.  What happens during the  procedure?  An IV will be inserted into one of your veins.  You will be given one or both of the following:  A medicine to help you relax (sedative).  A medicine to make you fall asleep (general anesthetic).  A small, thin tube (catheter) may be inserted through your urethra and into your bladder. This will drain urine during your procedure.  Depending on the type of procedure you are having, one incision or several small incisions will be made in your abdomen.  Your fallopian tube and ovary will be cut away from the uterus and removed.  Your blood vessels will be clamped and tied to prevent excess bleeding.  The incision or incisions in your abdomen will be closed with stitches (sutures), staples, or skin glue.  A bandage (dressing) may be placed over your incision or incisions.  The procedure may vary among health care providers and hospitals.  What happens after the procedure?    Your blood pressure, heart rate, breathing rate, and blood oxygen level will be monitored until you leave the hospital or clinic.  You may continue to receive fluids and medicines through an IV.  You may continue to have a catheter draining your urine.  You may have to wear compression stockings. These stockings help to prevent blood clots and reduce swelling in your legs.  You will be given pain medicine as needed.  If you were given a sedative during the procedure, it can affect you for several hours. Do not drive or operate machinery until your health care provider says that it is safe.  Summary  Salpingectomy is a surgical procedure to remove one of the fallopian tubes.  The procedure may be done with an open incision, a thin, lighted tube with a tiny camera (laparoscope), or computer-controlled instruments.  Depending on the type of procedure you have, one incision or several small incisions will be made in your abdomen.  Your blood pressure, heart rate, breathing rate, and blood oxygen level will be monitored until you leave the  hospital or clinic.  Plan to have a responsible adult take you home from the hospital or clinic.  This information is not intended to replace advice given to you by your health care provider. Make sure you discuss any questions you have with your health care provider.  Document Revised: 07/22/2020 Document Reviewed: 07/22/2020  Elsevier Patient Education  2022 [employer]

## 2021-09-04 NOTE — Progress Notes (Signed)
Patient ID: Linda Benitez, female   DOB: Jul 23, 1986, 35 y.o.   MRN: [id number]  Reason for Consult: Gynecologic Exam   Referred by Dortha Kern, MD  Subjective:     HPI:  Linda Benitez is a 35 y.o. female she presents today for sterilization consult.  She reports that she has been taking birth control pills since 2007.  She was recently diagnosed with hypertension and is a active smoker and is in need of transitioning off of her estrogen progesterone birth control pills.  She would like to undergo sterilization procedure.  She has never had children although she reports that she has raised 2 stepchildren since they were infants and does not desire to start over.  She feels fulfilled as a parent and has raised her step children as her own. She does not desire future pregnancy.   Gynecological History  Patient's last menstrual period was 08/12/2021. Menarche: 12-14  She denies passage of large clots She denies sensations of gushing or flooding of blood. She denies accidents where she bleeds through her clothing. She denies that she changes a saturated pad or tampon more frequently than every hour.  She denies that pain from her periods limits her activities.  History of fibroids, polyps, or ovarian cysts? : no  History of PCOS? no Hstory of Endometriosis? no History of abnormal pap smears? no Have you had any sexually transmitted infections in the past? no  She denies HPV vaccination in the past.   Last NWG:NFAOZHY  She identifies as a female. She is sexually active with men.   She denies dyspareunia. She denies postcoital bleeding.  She currently uses OCP (estrogen/progesterone) for contraception.   Obstetrical History G0P0  No past medical history on file. No family history on file. Past Surgical History:  Procedure Laterality Date   MANDIBLE FRACTURE SURGERY      Short Social History:  Social History   Tobacco Use   Smoking status: Former   Smokeless  tobacco: Not on file  Substance Use Topics   Alcohol use: No    Alcohol/week: 0.0 standard drinks    Allergies  Allergen Reactions   Flexeril [Cyclobenzaprine] Hives    On jaw and neck after taking norco and flexeril   Norco [Hydrocodone-Acetaminophen] Hives    On jaw and neck after taking norco and flexeril   Bactrim [Sulfamethoxazole-Trimethoprim] Other (See Comments)    Adverse reaciton, per pt MD stated it was havinga  Reverse effect   Augmentin [Amoxicillin-Pot Clavulanate] Rash    Current Outpatient Medications  Medication Sig Dispense Refill   amLODipine (NORVASC) 5 MG tablet Take 5 mg by mouth daily.     escitalopram (LEXAPRO) 20 MG tablet Take 20 mg by mouth daily.     levonorgestrel-ethinyl estradiol (AVIANE,ALESSE,LESSINA) 0.1-20 MG-MCG tablet Take by mouth.     predniSONE (DELTASONE) 10 MG tablet Take 3 tablets once a day for 4 days 12 tablet 0   diazepam (VALIUM) 5 MG tablet Take 1 tablet (5 mg total) by mouth every 8 (eight) hours as needed for muscle spasms. (Patient not taking: Reported on 09/04/2021) 20 tablet 0   famotidine (PEPCID) 20 MG tablet Take 1 tablet (20 mg total) by mouth 2 (two) times daily. 14 tablet 0   fluticasone furoate-vilanterol (BREO ELLIPTA) 200-25 MCG/ACT AEPB Inhale into the lungs. (Patient not taking: Reported on 09/04/2021)     fluticasone-salmeterol (ADVAIR HFA) 115-21 MCG/ACT inhaler Inhale 2 puffs into the lungs 2 (two) times daily. (Patient not  taking: Reported on 06/14/2016) 1 Inhaler 12   ipratropium-albuterol (DUONEB) 0.5-2.5 (3) MG/3ML SOLN Take 3 mLs by nebulization every 4 (four) hours as needed. (Patient not taking: Reported on 09/04/2021) 360 mL 6   venlafaxine XR (EFFEXOR XR) 75 MG 24 hr capsule Take 1 capsule (75 mg total) by mouth daily with breakfast. (Patient not taking: Reported on 06/14/2016) 30 capsule 4   No current facility-administered medications for this visit.    Review of Systems  Constitutional: Negative for chills,  fatigue, fever and unexpected weight change.  HENT: Negative for trouble swallowing.  Eyes: Negative for loss of vision.  Respiratory: Negative for cough, shortness of breath and wheezing.  Cardiovascular: Negative for chest pain, leg swelling, palpitations and syncope.  GI: Negative for abdominal pain, blood in stool, diarrhea, nausea and vomiting.  GU: Negative for difficulty urinating, dysuria, frequency and hematuria.  Musculoskeletal: Negative for back pain, leg pain and joint pain.  Skin: Negative for rash.  Neurological: Negative for dizziness, headaches, light-headedness, numbness and seizures.  Psychiatric: Negative for behavioral problem, confusion, depressed mood and sleep disturbance.       Objective:  Objective   Vitals:   09/04/21 0828  BP: 136/74  Weight: 215 lb 12.8 oz (97.9 kg)  Height: 5\' 4"  (1.626 m)   Body mass index is 37.04 kg/m.  Physical Exam Vitals and nursing note reviewed. Exam conducted with a chaperone present.  Constitutional:      Appearance: Normal appearance. She is well-developed.  HENT:     Head: Normocephalic and atraumatic.  Eyes:     Extraocular Movements: Extraocular movements intact.     Pupils: Pupils are equal, round, and reactive to light.  Cardiovascular:     Rate and Rhythm: Normal rate and regular rhythm.  Pulmonary:     Effort: Pulmonary effort is normal. No respiratory distress.     Breath sounds: Normal breath sounds.  Abdominal:     General: Abdomen is flat.     Palpations: Abdomen is soft.  Genitourinary:    Comments: External: Normal appearing vulva. No lesions noted.  Speculum examination: Normal appearing cervix. No blood in the vaginal vault. No discharge.   Musculoskeletal:        General: No signs of injury.  Skin:    General: Skin is warm and dry.  Neurological:     Mental Status: She is alert and oriented to person, place, and time.  Psychiatric:        Behavior: Behavior normal.        Thought Content:  Thought content normal.        Judgment: Judgment normal.    Assessment/Plan:     35 y.o. No obstetric history on file.  Patient reports that she desires sterilization. She feels strongly that she does not desire children in the future.   We discussed alternative options for sterilization including long-acting reversible contraception methods. We discussed that sterilization procedures have a failure rate of approximately 1 in  1000.  We discussed potential surgical complications of sterile sterilization including infection damage to surrounding pelvic tissues and risk of bleeding.We discussed that sterilization procedure should be considered on reversible.  Having a reversal surgery to correct a tubal ligation is expensive, risks and ectopic pregnancy, and has high failure rates.  We discussed options of performing a tubal ligation including removing a segment of the fallopian tube or removing an entire fallopian tube.  We discussed that removal of entire fallopian tube has been associated with a  reduction in ovarian cancer risk, however this reduction in is small in her lifetime risk of ovarian cancer is approximately 1 in 100.   Patient feels sure of her decision to have a sterilization procedure.  She desires to have a laparoscopic bilateral salpingectomy.  Note sent to surgical scheduler O who will reach out to the patient to schedule surgery.  Pap smear today.   More than 30 minutes were spent face to face with the patient in the room, reviewing the medical record, labs and images, and coordinating care for the patient. The plan of management was discussed in detail and counseling was provided.     Adrian Prows MD [facility], [facility] 09/04/2021 8:54 AM

## 2021-09-11 LAB — CYTOLOGY - PAP
Comment: NEGATIVE
Diagnosis: NEGATIVE
Diagnosis: REACTIVE
High risk HPV: NEGATIVE

## 2021-09-17 ENCOUNTER — Telehealth: Payer: Self-pay | Admitting: Obstetrics and Gynecology

## 2021-09-17 NOTE — Telephone Encounter (Signed)
Spoke with the patient, surgery is scheduled for 09/29/21, Pre-admit testing phone interview to be scheduled (patient will watch for MyChart notification), and Post Op on 1/25 @ 9:55am. Patient confirmed [employer].

## 2021-09-17 NOTE — Telephone Encounter (Signed)
-----   Message from Natale Milch, MD sent at 09/04/2021  9:03 AM EST ----- Surgery Booking Request Patient Full Name:  Linda Benitez  MRN: [id number]  DOB: 1986/03/11  Surgeon: Natale Milch, MD  Requested Surgery Date and Time: at patient preference Primary Diagnosis AND Code: desires sterilization Secondary Diagnosis and Code:  Surgical Procedure: Robot assisted bilateral salpingectomy RNFA Requested?: Yes L&D Notification: No Admission Status: same day surgery Length of Surgery: 50 min Special Case Needs: No H&P: No Phone Interview???:  Yes Interpreter: No Medical Clearance:  No Special Scheduling Instructions: No Any known health/anesthesia issues, diabetes, sleep apnea, latex allergy, defibrillator/pacemaker?: No Acuity: P2   (P1 highest, P2 delay may cause harm, P3 low, elective gyn, P4 lowest) Post op follow up visits: 1` week post op

## 2021-09-23 ENCOUNTER — Other Ambulatory Visit: Payer: Self-pay

## 2021-09-23 ENCOUNTER — Other Ambulatory Visit
Admission: RE | Admit: 2021-09-23 | Discharge: 2021-09-23 | Disposition: A | Discharge status: Home / Self Care | Payer: BC Managed Care – PPO | Source: Ambulatory Visit | Attending: Obstetrics and Gynecology | Admitting: Obstetrics and Gynecology

## 2021-09-23 VITALS — Ht 64.0 in | Wt 217.0 lb

## 2021-09-23 DIAGNOSIS — I2510 Atherosclerotic heart disease of native coronary artery without angina pectoris: Secondary | ICD-10-CM

## 2021-09-23 HISTORY — DX: Essential (primary) hypertension: I10

## 2021-09-23 HISTORY — DX: Gastro-esophageal reflux disease without esophagitis: K21.9

## 2021-09-23 HISTORY — DX: Pneumonia, unspecified organism: J18.9

## 2021-09-23 NOTE — Patient Instructions (Signed)
Your procedure is scheduled on: Tuesday September 29, 2021. Report to Day Surgery inside [facility] 2nd floor. To find out your arrival time please call [phone] between 1PM - 3PM on Monday September 28, 2021.  Remember: Instructions that are not followed completely may result in serious medical risk,  up to and including death, or upon the discretion of your surgeon and anesthesiologist your  surgery may need to be rescheduled.     _X__ 1. Do not eat food after midnight the night before your procedure.                 No chewing gum or hard candies. You may drink clear liquids up to 2 hours                 before you are scheduled to arrive for your surgery- DO not drink clear                 liquids within 2 hours of the start of your surgery.                 Clear Liquids include:  water, apple juice without pulp, clear Gatorade, G2 or                  Gatorade Zero (avoid Red/Purple/Blue), Black Coffee or Tea (Do not add                 anything to coffee or tea).  __X__2.  On the morning of surgery brush your teeth with toothpaste and water, you                may rinse your mouth with mouthwash if you wish.  Do not swallow any toothpaste or mouthwash.     _X__ 3.  No Alcohol for 24 hours before or after surgery.   _X__ 4.  Do Not Smoke or use e-cigarettes For 24 Hours Prior to Your Surgery.                 Do not use any chewable tobacco products for at least 6 hours prior to                 Surgery.  _X__  5.  Do not use any recreational drugs (marijuana, cocaine, heroin, ecstasy, MDMA or other)                For at least one week prior to your surgery.  Combination of these drugs with anesthesia                May have life threatening results.  ____  6.  Bring all medications with you on the day of surgery if instructed.   __X__  7.  Notify your doctor if there is any change in your medical condition      (cold, fever, infections).     Do not  wear jewelry, make-up, hairpins, clips or nail polish. Do not wear lotions, powders, or perfumes. You may wear deodorant. Do not shave 48 hours prior to surgery.  Do not bring valuables to the hospital.    [facility] is not responsible for any belongings or valuables.  Contacts, dentures or bridgework may not be worn into surgery. Leave your suitcase in the car. After surgery it may be brought to your room. For patients admitted to the hospital, discharge time is determined by your treatment team.   Patients discharged the day of surgery will  not be allowed to drive home.   Make arrangements for someone to be with you for the first 24 hours of your Same Day Discharge.   __X__ Take these medicines the morning of surgery with A SIP OF WATER:    1. amLODipine (NORVASC) 5 MG   2. escitalopram (LEXAPRO) 20 MG  3  omeprazole (PRILOSEC OTC) 20 MG  4.  5.  6.  ____ Fleet Enema (as directed)   __X__ Use CHG Soap (or wipes) as directed  ____ Use Benzoyl Peroxide Gel as instructed  __X__ Use inhalers on the day of surgery  albuterol (VENTOLIN HFA) 108 (90 Base) MCG/ACT inhaler  ADVAIR DISKUS 250-50 MCG/ACT AEPB  ____ Stop metformin 2 days prior to surgery    ____ Take 1/2 of usual insulin dose the night before surgery. No insulin the morning          of surgery.   ____ Call your PCP, cardiologist, or Pulmonologist if taking Coumadin/Plavix/aspirin and ask when to stop before your surgery.   __X__ One Week prior to surgery- Stop Anti-inflammatories such as Ibuprofen, Aleve, Advil, Motrin, meloxicam (MOBIC), diclofenac, etodolac, ketorolac, Toradol, Daypro, piroxicam, Goody's or BC powders. OK TO USE TYLENOL IF NEEDED   __X__ Do not star any vitamins and or supplements until after surgery.    ____ Bring C-Pap to the hospital.    If you have any questions regarding your pre-procedure instructions,  Please call Pre-admit Testing at [phone]

## 2021-09-25 ENCOUNTER — Other Ambulatory Visit: Payer: Self-pay

## 2021-09-25 ENCOUNTER — Other Ambulatory Visit
Admission: RE | Admit: 2021-09-25 | Discharge: 2021-09-25 | Disposition: A | Discharge status: Home / Self Care | Payer: BC Managed Care – PPO | Source: Ambulatory Visit | Attending: Obstetrics and Gynecology | Admitting: Obstetrics and Gynecology

## 2021-09-25 DIAGNOSIS — I2510 Atherosclerotic heart disease of native coronary artery without angina pectoris: Secondary | ICD-10-CM | POA: Insufficient documentation

## 2021-09-25 DIAGNOSIS — Z01810 Encounter for preprocedural cardiovascular examination: Secondary | ICD-10-CM | POA: Diagnosis not present

## 2021-09-25 DIAGNOSIS — Z01818 Encounter for other preprocedural examination: Secondary | ICD-10-CM | POA: Diagnosis present

## 2021-09-25 LAB — BASIC METABOLIC PANEL
Anion gap: 10 (ref 5–15)
BUN: 9 mg/dL (ref 6–20)
CO2: 24 mmol/L (ref 22–32)
Calcium: 9.3 mg/dL (ref 8.9–10.3)
Chloride: 103 mmol/L (ref 98–111)
Creatinine, Ser: 1.01 mg/dL — ABNORMAL HIGH (ref 0.44–1.00)
GFR, Estimated: >60 mL/min (ref >60)
Glucose, Bld: 118 mg/dL — ABNORMAL HIGH (ref 70–99)
Potassium: 3.5 mmol/L (ref 3.5–5.1)
Sodium: 137 mmol/L (ref 135–145)

## 2021-09-28 MED ORDER — LACTATED RINGERS IV SOLN: INTRAVENOUS | Status: DC

## 2021-09-28 MED ORDER — ORAL CARE MOUTH RINSE: 15.0000 mL | Freq: Once | OROMUCOSAL | Status: AC

## 2021-09-28 MED ORDER — CHLORHEXIDINE GLUCONATE 0.12 % MT SOLN: 15.0000 mL | Freq: Once | OROMUCOSAL | Status: AC

## 2021-09-28 NOTE — Anesthesia Preprocedure Evaluation (Addendum)
Anesthesia Evaluation  Patient identified by MRN, date of birth, ID band Patient awake    Reviewed: Allergy & Precautions, NPO status , Patient's Chart, lab work & pertinent test results  History of Anesthesia Complications Negative for: history of anesthetic complications  Airway Mallampati: II   Neck ROM: Full    Dental  (+)    Pulmonary former smoker (quit 2013),  Chronic cough; current vaping   Pulmonary exam normal breath sounds clear to auscultation       Cardiovascular hypertension, Normal cardiovascular exam Rhythm:Regular Rate:Normal  ECG 09/25/21:  Normal sinus rhythm Cannot rule out Anterior infarct , age undetermined   Neuro/Psych negative neurological ROS     GI/Hepatic GERD  ,  Endo/Other  Obesity   Renal/GU negative Renal ROS     Musculoskeletal   Abdominal   Peds  Hematology negative hematology ROS (+)   Anesthesia Other Findings   Reproductive/Obstetrics                           Anesthesia Physical Anesthesia Plan  ASA: 2  Anesthesia Plan: General   Post-op Pain Management:    Induction: Intravenous  PONV Risk Score and Plan: 3 and Ondansetron, Dexamethasone and Treatment may vary due to age or medical condition  Airway Management Planned: Oral ETT  Additional Equipment:   Intra-op Plan:   Post-operative Plan: Extubation in OR  Informed Consent: I have reviewed the patients History and Physical, chart, labs and discussed the procedure including the risks, benefits and alternatives for the proposed anesthesia with the patient or authorized representative who has indicated his/her understanding and acceptance.     Dental advisory given  Plan Discussed with: CRNA  Anesthesia Plan Comments: (Patient consented for risks of anesthesia including but not limited to:  - adverse reactions to medications - damage to eyes, teeth, lips or other oral mucosa -  nerve damage due to positioning  - sore throat or hoarseness - damage to heart, brain, nerves, lungs, other parts of body or loss of life  Informed patient about role of CRNA in peri- and intra-operative care.  Patient voiced understanding.)        Anesthesia Quick Evaluation

## 2021-09-29 ENCOUNTER — Ambulatory Visit: Payer: BC Managed Care – PPO | Admitting: Anesthesiology

## 2021-09-29 ENCOUNTER — Ambulatory Visit
Admission: RE | Admit: 2021-09-29 | Discharge: 2021-09-29 | Disposition: A | Discharge status: Home / Self Care | Payer: BC Managed Care – PPO | Attending: Obstetrics and Gynecology | Admitting: Obstetrics and Gynecology

## 2021-09-29 ENCOUNTER — Encounter: Payer: Self-pay | Admitting: Obstetrics and Gynecology

## 2021-09-29 ENCOUNTER — Encounter
Admission: RE | Disposition: A | Discharge status: Home / Self Care | Payer: Self-pay | Source: Home / Self Care | Attending: Obstetrics and Gynecology

## 2021-09-29 ENCOUNTER — Other Ambulatory Visit: Payer: Self-pay

## 2021-09-29 DIAGNOSIS — F17290 Nicotine dependence, other tobacco product, uncomplicated: Secondary | ICD-10-CM | POA: Insufficient documentation

## 2021-09-29 DIAGNOSIS — Z302 Encounter for sterilization: Secondary | ICD-10-CM | POA: Diagnosis present

## 2021-09-29 DIAGNOSIS — K219 Gastro-esophageal reflux disease without esophagitis: Secondary | ICD-10-CM | POA: Diagnosis not present

## 2021-09-29 DIAGNOSIS — Z3009 Encounter for other general counseling and advice on contraception: Secondary | ICD-10-CM

## 2021-09-29 DIAGNOSIS — Z793 Long term (current) use of hormonal contraceptives: Secondary | ICD-10-CM | POA: Diagnosis not present

## 2021-09-29 DIAGNOSIS — I10 Essential (primary) hypertension: Secondary | ICD-10-CM | POA: Insufficient documentation

## 2021-09-29 HISTORY — PX: XI ROBOTIC ASSISTED SALPINGECTOMY: SHX6824

## 2021-09-29 LAB — CBC
HCT: 37.6 % (ref 36.0–46.0)
Hemoglobin: 12.4 g/dL (ref 12.0–15.0)
MCH: 28.6 pg (ref 26.0–34.0)
MCHC: 33 g/dL (ref 30.0–36.0)
MCV: 86.6 fL (ref 80.0–100.0)
Platelets: 374 10*3/uL (ref 150–400)
RBC: 4.34 MIL/uL (ref 3.87–5.11)
RDW: 12.8 % (ref 11.5–15.5)
WBC: 9.4 10*3/uL (ref 4.0–10.5)
nRBC: 0 % (ref 0.0–0.2)

## 2021-09-29 LAB — POCT PREGNANCY, URINE: Preg Test, Ur: NEGATIVE

## 2021-09-29 LAB — TYPE AND SCREEN
ABO/RH(D): O POS
Antibody Screen: NEGATIVE

## 2021-09-29 SURGERY — SALPINGECTOMY, ROBOT-ASSISTED
Anesthesia: General | Site: Abdomen | Laterality: Bilateral

## 2021-09-29 MED ORDER — ESMOLOL HCL 100 MG/10ML IV SOLN
INTRAVENOUS | Status: AC
  Filled 2021-09-29: qty 10

## 2021-09-29 MED ORDER — LACTATED RINGERS IV SOLN: INTRAVENOUS | Status: DC

## 2021-09-29 MED ORDER — KETOROLAC TROMETHAMINE 30 MG/ML IJ SOLN
INTRAMUSCULAR | Status: AC
  Filled 2021-09-29: qty 1

## 2021-09-29 MED ORDER — BUPIVACAINE LIPOSOME 1.3 % IJ SUSP
INTRAMUSCULAR | Status: AC
  Filled 2021-09-29: qty 20

## 2021-09-29 MED ORDER — LIDOCAINE HCL (PF) 2 % IJ SOLN
INTRAMUSCULAR | Status: AC
  Filled 2021-09-29: qty 5

## 2021-09-29 MED ORDER — POVIDONE-IODINE 10 % EX SWAB
2.0000 "application " | Freq: Once | CUTANEOUS | Status: AC
  Administered 2021-09-29: 2 via TOPICAL

## 2021-09-29 MED ORDER — ACETAMINOPHEN 10 MG/ML IV SOLN
INTRAVENOUS | Status: AC
  Filled 2021-09-29: qty 100

## 2021-09-29 MED ORDER — FENTANYL CITRATE (PF) 100 MCG/2ML IJ SOLN
INTRAMUSCULAR | Status: DC | PRN
Start: 2021-09-29 — End: 2021-09-29
  Administered 2021-09-29: 50 ug via INTRAVENOUS

## 2021-09-29 MED ORDER — HYDROMORPHONE HCL 1 MG/ML IJ SOLN
INTRAMUSCULAR | Status: AC
  Filled 2021-09-29: qty 1

## 2021-09-29 MED ORDER — CHLORHEXIDINE GLUCONATE 0.12 % MT SOLN
OROMUCOSAL | Status: AC
  Administered 2021-09-29: 15 mL via OROMUCOSAL
  Filled 2021-09-29: qty 15

## 2021-09-29 MED ORDER — PROPOFOL 10 MG/ML IV BOLUS
INTRAVENOUS | Status: DC | PRN
  Administered 2021-09-29: 20 mg via INTRAVENOUS
  Administered 2021-09-29: 180 mg via INTRAVENOUS

## 2021-09-29 MED ORDER — ONDANSETRON HCL 4 MG/2ML IJ SOLN: 4.0000 mg | Freq: Once | INTRAMUSCULAR | Status: DC | PRN

## 2021-09-29 MED ORDER — DEXAMETHASONE SODIUM PHOSPHATE 10 MG/ML IJ SOLN
INTRAMUSCULAR | Status: AC
  Filled 2021-09-29: qty 1

## 2021-09-29 MED ORDER — DEXMEDETOMIDINE (PRECEDEX) IN NS 20 MCG/5ML (4 MCG/ML) IV SYRINGE
PREFILLED_SYRINGE | INTRAVENOUS | Status: AC
  Filled 2021-09-29: qty 5

## 2021-09-29 MED ORDER — FENTANYL CITRATE (PF) 100 MCG/2ML IJ SOLN: 25.0000 ug | INTRAMUSCULAR | Status: DC | PRN

## 2021-09-29 MED ORDER — MIDAZOLAM HCL 2 MG/2ML IJ SOLN
INTRAMUSCULAR | Status: DC | PRN
  Administered 2021-09-29 (×2): 1 mg via INTRAVENOUS

## 2021-09-29 MED ORDER — FENTANYL CITRATE (PF) 100 MCG/2ML IJ SOLN
INTRAMUSCULAR | Status: AC
  Filled 2021-09-29: qty 2

## 2021-09-29 MED ORDER — ROCURONIUM BROMIDE 10 MG/ML (PF) SYRINGE
PREFILLED_SYRINGE | INTRAVENOUS | Status: AC
  Filled 2021-09-29: qty 10

## 2021-09-29 MED ORDER — ESMOLOL HCL 100 MG/10ML IV SOLN
INTRAVENOUS | Status: DC | PRN
Start: 2021-09-29 — End: 2021-09-29
  Administered 2021-09-29: 330 mg via INTRAVENOUS

## 2021-09-29 MED ORDER — DEXAMETHASONE SODIUM PHOSPHATE 10 MG/ML IJ SOLN
INTRAMUSCULAR | Status: DC | PRN
  Administered 2021-09-29: 10 mg via INTRAVENOUS

## 2021-09-29 MED ORDER — PROPOFOL 10 MG/ML IV BOLUS
INTRAVENOUS | Status: AC
  Filled 2021-09-29: qty 20

## 2021-09-29 MED ORDER — ONDANSETRON HCL 4 MG/2ML IJ SOLN
INTRAMUSCULAR | Status: DC | PRN
Start: 2021-09-29 — End: 2021-09-29
  Administered 2021-09-29: 4 mg via INTRAVENOUS

## 2021-09-29 MED ORDER — LIDOCAINE HCL (CARDIAC) PF 100 MG/5ML IV SOSY
PREFILLED_SYRINGE | INTRAVENOUS | Status: DC | PRN
  Administered 2021-09-29: 20 mg via INTRAVENOUS
  Administered 2021-09-29: 80 mg via INTRAVENOUS

## 2021-09-29 MED ORDER — MIDAZOLAM HCL 2 MG/2ML IJ SOLN
INTRAMUSCULAR | Status: AC
  Filled 2021-09-29: qty 2

## 2021-09-29 MED ORDER — KETOROLAC TROMETHAMINE 30 MG/ML IJ SOLN
INTRAMUSCULAR | Status: DC | PRN
  Administered 2021-09-29: 30 mg via INTRAVENOUS

## 2021-09-29 MED ORDER — IBUPROFEN 600 MG PO TABS
600.0000 mg | ORAL_TABLET | Freq: Four times a day (QID) | ORAL | 0 refills | Status: AC | PRN
Start: 2021-09-29 — End: ?

## 2021-09-29 MED ORDER — ROCURONIUM BROMIDE 100 MG/10ML IV SOLN
INTRAVENOUS | Status: DC | PRN
  Administered 2021-09-29: 50 mg via INTRAVENOUS
  Administered 2021-09-29: 10 mg via INTRAVENOUS

## 2021-09-29 MED ORDER — 0.9 % SODIUM CHLORIDE (POUR BTL) OPTIME
TOPICAL | Status: DC | PRN
  Administered 2021-09-29: 50 mL

## 2021-09-29 MED ORDER — BUPIVACAINE LIPOSOME 1.3 % IJ SUSP
INTRAMUSCULAR | Status: DC | PRN
  Administered 2021-09-29: 20 mL

## 2021-09-29 MED ORDER — SUGAMMADEX SODIUM 200 MG/2ML IV SOLN
INTRAVENOUS | Status: DC | PRN
  Administered 2021-09-29: 200 mg via INTRAVENOUS

## 2021-09-29 MED ORDER — HYDROMORPHONE HCL 1 MG/ML IJ SOLN
INTRAMUSCULAR | Status: DC | PRN
  Administered 2021-09-29: .5 mg via INTRAVENOUS

## 2021-09-29 MED ORDER — ONDANSETRON HCL 4 MG/2ML IJ SOLN
INTRAMUSCULAR | Status: AC
  Filled 2021-09-29: qty 2

## 2021-09-29 MED ORDER — ACETAMINOPHEN 10 MG/ML IV SOLN
INTRAVENOUS | Status: DC | PRN
  Administered 2021-09-29: 1000 mg via INTRAVENOUS

## 2021-09-29 MED ORDER — DEXMEDETOMIDINE (PRECEDEX) IN NS 20 MCG/5ML (4 MCG/ML) IV SYRINGE
PREFILLED_SYRINGE | INTRAVENOUS | Status: DC | PRN
  Administered 2021-09-29 (×2): 10 ug via INTRAVENOUS

## 2021-09-29 SURGICAL SUPPLY — 58 items
APPLICATOR ARISTA FLEXITIP XL (MISCELLANEOUS) IMPLANT
BAG LAPAROSCOPIC 12 15 PORT 16 (BASKET) IMPLANT
BAG RETRIEVAL 12/15 (BASKET)
BAG URINE DRAIN 2000ML AR STRL (UROLOGICAL SUPPLIES) ×2 IMPLANT
BASIN GRAD PLASTIC 32OZ STRL (MISCELLANEOUS) ×2 IMPLANT
BLADE SURG SZ10 CARB STEEL (BLADE) IMPLANT
BLADE SURG SZ11 CARB STEEL (BLADE) ×2 IMPLANT
CATH FOLEY SIL 2WAY 14FR5CC (CATHETERS) ×2 IMPLANT
CHLORAPREP W/TINT 26 (MISCELLANEOUS) ×2 IMPLANT
COVER MAYO STAND REUSABLE (DRAPES) ×2 IMPLANT
COVER WAND RF STERILE (DRAPES) ×2 IMPLANT
DERMABOND ADVANCED (GAUZE/BANDAGES/DRESSINGS) ×1
DERMABOND ADVANCED .7 DNX12 (GAUZE/BANDAGES/DRESSINGS) ×1 IMPLANT
DRAPE ARM DVNC X/XI (DISPOSABLE) ×3 IMPLANT
DRAPE COLUMN DVNC XI (DISPOSABLE) ×1 IMPLANT
DRAPE DA VINCI XI ARM (DISPOSABLE) ×3
DRAPE DA VINCI XI COLUMN (DISPOSABLE) ×1
DRAPE ROBOT W/ LEGGING 30X125 (DRAPES) ×2 IMPLANT
DRSG TEGADERM 2-3/8X2-3/4 SM (GAUZE/BANDAGES/DRESSINGS) ×6 IMPLANT
ELECT REM PT RETURN 9FT ADLT (ELECTROSURGICAL) ×2
ELECTRODE REM PT RTRN 9FT ADLT (ELECTROSURGICAL) ×1 IMPLANT
GAUZE 4X4 16PLY ~~LOC~~+RFID DBL (SPONGE) ×2 IMPLANT
GLOVE SURG ENC MOIS LTX SZ7 (GLOVE) ×4 IMPLANT
GLOVE SURG UNDER POLY LF SZ7.5 (GLOVE) ×4 IMPLANT
GOWN STRL REUS W/ TWL LRG LVL3 (GOWN DISPOSABLE) ×3 IMPLANT
GOWN STRL REUS W/TWL LRG LVL3 (GOWN DISPOSABLE) ×3
GRASPER SUT TROCAR 14GX15 (MISCELLANEOUS) ×2 IMPLANT
HEMOSTAT ARISTA ABSORB 3G PWDR (HEMOSTASIS) IMPLANT
IRRIGATOR SUCT 8 DISP DVNC XI (IRRIGATION / IRRIGATOR) IMPLANT
IRRIGATOR SUCTION 8MM XI DISP (IRRIGATION / IRRIGATOR)
KIT PINK PAD W/HEAD ARE REST (MISCELLANEOUS) ×2
KIT PINK PAD W/HEAD ARM REST (MISCELLANEOUS) ×1 IMPLANT
MANIFOLD NEPTUNE II (INSTRUMENTS) ×2 IMPLANT
MANIPULATOR URINE KRONNER 5 (MISCELLANEOUS) IMPLANT
MANIPULATOR UTERINE 4.5 ZUMI (MISCELLANEOUS) ×2 IMPLANT
NEEDLE HYPO 22GX1.5 SAFETY (NEEDLE) ×2 IMPLANT
OBTURATOR OPTICAL STANDARD 8MM (TROCAR) ×1
OBTURATOR OPTICAL STND 8 DVNC (TROCAR) ×1
OBTURATOR OPTICALSTD 8 DVNC (TROCAR) ×1 IMPLANT
PACK GYN LAPAROSCOPIC (MISCELLANEOUS) ×2 IMPLANT
PAD ARMBOARD 7.5X6 YLW CONV (MISCELLANEOUS) ×2 IMPLANT
PAD OB MATERNITY 4.3X12.25 (PERSONAL CARE ITEMS) ×2 IMPLANT
PAD PREP 24X41 OB/GYN DISP (PERSONAL CARE ITEMS) ×2 IMPLANT
SCRUB EXIDINE 4% CHG 4OZ (MISCELLANEOUS) ×2 IMPLANT
SEAL CANN UNIV 5-8 DVNC XI (MISCELLANEOUS) ×3 IMPLANT
SEAL XI 5MM-8MM UNIVERSAL (MISCELLANEOUS) ×3
SEALER VESSEL DA VINCI XI (MISCELLANEOUS) ×1
SEALER VESSEL EXT DVNC XI (MISCELLANEOUS) ×1 IMPLANT
SET TUBE SMOKE EVAC HIGH FLOW (TUBING) ×2 IMPLANT
SPONGE GAUZE 2X2 8PLY STRL LF (GAUZE/BANDAGES/DRESSINGS) ×6 IMPLANT
SURGILUBE 2OZ TUBE FLIPTOP (MISCELLANEOUS) ×2 IMPLANT
SUT MNCRL 4-0 (SUTURE) ×1
SUT MNCRL 4-0 27XMFL (SUTURE) ×1
SUT VIC AB 0 CT1 36 (SUTURE) IMPLANT
SUTURE MNCRL 4-0 27XMF (SUTURE) ×1 IMPLANT
SYR 10ML LL (SYRINGE) ×2 IMPLANT
TAPE TRANSPORE STRL 2 31045 (GAUZE/BANDAGES/DRESSINGS) ×2 IMPLANT
WATER STERILE IRR 500ML POUR (IV SOLUTION) ×2 IMPLANT

## 2021-09-29 NOTE — Op Note (Signed)
Operative Note   PRE-OP DIAGNOSIS: Desires Sterilization   POST-OP DIAGNOSIS: Desires Sterilization  SURGEON: Adelene Idler MD  ANESTHESIA: General  PROCEDURE: Procedure(s):Robotic assisted laparoscopic bilateral salpingectomy    ESTIMATED BLOOD LOSS: 5 cc  DRAINS: Foley  SPECIMENS: Bilateral fallopian tubes   COMPLICATIONS: None  DISPOSITION: PACU  CONDITION: Stable  INDICATIONS: Desires sterilization  FINDINGS: Exam under anesthesia revealed an 8 week uterus. There was without adnexal masses or nodularity. The parametria was smooth. The cervix was negative for gross lesions. Intraoperative findings included: The uterus was  grossly normal. The adnexa was  normal bilaterally. The upper abdomen was  normal including omentum, bowel, liver, stomach, and diaphragmatic surfaces. There was no evidence of grossly enlarged pelvic or right para-aortic lymph nodes.   PROCEDURE IN DETAIL: After informed consent was obtained, the patient was taken to the operating room where anesthesia was obtained without difficulty. The patient was positioned in the dorsal lithotomy position in [place] stirrups and her arms were carefully tucked at her sides and the usual precautions were taken.  She was prepped and draped in normal sterile fashion.  Time-out was performed. A foley catheter was placed. A speculum was placed in the vagina and the cervical os was dilated. The uterus sounded to 8 cm.  A standard zumi uterine manipulator was then placed in the uterus without incident.    Laparoscopic entry was obtained via an umbilical incision and direct entry. The 8 mm robotic optiview port was placed, abdomen insuffulated, and pelvis visualized with noted findings above.  The patient was placed in Trendelenburg and the bowel was displaced up into the upper abdomen.  The 2 additional robotic port were placed in a horizontal line across the upper abdomen. Robotic docking was performed.  The right and left  fallopian tubes were identified. The vessel sealer was used to seal and excise the right and then the left fallopian tube. The fallopian tubes were passed through the robotic port.  Excellent hemostasis was noted.      The instruments were then all removed from the abdomen. The robot was undocked. The air was expelled from the abdomen. The trocars were removed. The skin was closed with 4-0 monocryl with a subcuticular stitch and Indermil glue.  The patient tolerated the procedure well.  Sponge, lap and needle counts were correct x2.  The patient was taken to recovery room in excellent condition.  The foley was removed from the bladder. The uterine manipulator was removed from the uterus.    Adelene Idler MD, [facility], [facility] 09/29/2021 10:49 AM

## 2021-09-29 NOTE — Anesthesia Postprocedure Evaluation (Signed)
Anesthesia Post Note  Patient: Linda Benitez  Procedure(s) Performed: XI ROBOTIC ASSISTED SALPINGECTOMY (Bilateral: Abdomen)  Patient location during evaluation: PACU Anesthesia Type: General Level of consciousness: awake and alert, oriented and patient cooperative Pain management: pain level controlled Vital Signs Assessment: post-procedure vital signs reviewed and stable Respiratory status: spontaneous breathing, nonlabored ventilation and respiratory function stable Cardiovascular status: blood pressure returned to baseline and stable Postop Assessment: adequate PO intake Anesthetic complications: no   No notable events documented.   Last Vitals:  Vitals:   09/29/21 1115 09/29/21 1146  BP: 122/83 125/83  Pulse: 67 70  Resp: 16 14  Temp: 36.6 C 36.9 C  SpO2: 97% 93%    Last Pain:  Vitals:   09/29/21 1146  TempSrc:   PainSc: 0-No pain                 Reed Breech

## 2021-09-29 NOTE — Anesthesia Procedure Notes (Signed)
Procedure Name: Intubation Date/Time: 09/29/2021 9:34 AM Performed by: Loletha Grayer, CRNA Pre-anesthesia Checklist: Patient identified, Patient being monitored, Timeout performed, Emergency Drugs available and Suction available Patient Re-evaluated:Patient Re-evaluated prior to induction Oxygen Delivery Method: Circle system utilized Preoxygenation: Pre-oxygenation with 100% oxygen Induction Type: IV induction Ventilation: Mask ventilation without difficulty Laryngoscope Size: Mac and 3 Grade View: Grade I Tube type: Oral Tube size: 7.0 mm Number of attempts: 1 Airway Equipment and Method: Stylet Placement Confirmation: ETT inserted through vocal cords under direct vision, positive ETCO2 and breath sounds checked- equal and bilateral Secured at: 21 cm Tube secured with: Tape Dental Injury: Teeth and Oropharynx as per pre-operative assessment

## 2021-09-29 NOTE — Discharge Instructions (Addendum)
Postoperative Instructions  Take Motrin 600 mg and Tylenol 1000 mg every 6 hours for pain control.   I recommend taking this medication consistently for at least 1 week after your surgery.  Please take small walks around your living space every 2-3 hours after your surgery.  You can start this the day of your surgery or the day after your surgery.  Continue this for the first 7 days after surgery. After the first 7 days you can slowly increase you activity.  Do not lift heavy objects for 2 weeks after the surgery.    Try not to strain with bowel movements.  It can be a good idea to take an over-the-counter stool softener after your surgery.  You can take Colace 100 mg twice a day.  Try to drink 60 to 90 ounces of water a day to keep her stools soft.  After the surgery your diet should consist of foods that are easy on your stomach.  Soups, sandwiches, crackers, rice, applesauce, popsicles, mashed potatoes, cooked vegetables or canned fruits are best.  After you have a normal bowel movement you can return to your regular diet.  You should have a bowel movement within 3 days of the surgery.  If you not able to have a bowel movement please contact her office so we can review over-the-counter options to help have a bowel movement.  Walking regularly will help with having a bowel movement.  Please let us know if you have any heavy bleeding from the vagina.  Small amounts of bleeding are typical.  If you have a bandage covering your incisions he could remove it the day after your surgery.  Your incisions are closed with dissolvable suture and a topical glue.  The topical glue you can peel off gently in the shower 2 weeks after your surgery.  You should shower every day after the surgery.  You can let soapy water gently run over the incisions to keep them clean.  Please contact us if your incisions appear infected or if you have any drainage of fluid from the incisions.  I would like to know about these  symptoms same day since wound infections can get bad fast.  AMBULATORY SURGERY  DISCHARGE INSTRUCTIONS   The drugs that you were given will stay in your system until tomorrow so for the next 24 hours you should not:  Drive an automobile Make any legal decisions Drink any alcoholic beverage   You may resume regular meals tomorrow.  Today it is better to start with liquids and gradually work up to solid foods.  You may eat anything you prefer, but it is better to start with liquids, then soup and crackers, and gradually work up to solid foods.   Please notify your doctor immediately if you have any unusual bleeding, trouble breathing, redness and pain at the surgery site, drainage, fever, or pain not relieved by medication.    Additional Instructions:        Please contact your physician with any problems or Same Day Surgery at [phone], Monday through Friday 6 am to 4 pm, or [facility] number at [phone].

## 2021-09-29 NOTE — Transfer of Care (Signed)
Immediate Anesthesia Transfer of Care Note  Patient: Linda Benitez  Procedure(s) Performed: XI ROBOTIC ASSISTED SALPINGECTOMY (Bilateral: Abdomen)  Patient Location: PACU  Anesthesia Type:General  Level of Consciousness: awake  Airway & Oxygen Therapy: Patient Spontanous Breathing  Post-op Assessment: Report given to RN and Post -op Vital signs reviewed and stable  Post vital signs: Reviewed and stable  Last Vitals:  Vitals Value Taken Time  BP 144/87 09/29/21 1051  Temp    Pulse 84 09/29/21 1057  Resp 15 09/29/21 1057  SpO2 97 % 09/29/21 1057  Vitals shown include unvalidated device data.  Last Pain:  Vitals:   09/29/21 0830  TempSrc: Temporal  PainSc: 0-No pain         Complications: No notable events documented.

## 2021-09-29 NOTE — Interval H&P Note (Signed)
History and Physical Interval Note:  09/29/2021 9:07 AM  Linda Benitez  has presented today for surgery, with the diagnosis of Desires sterilization.  The various methods of treatment have been discussed with the patient and family. After consideration of risks, benefits and other options for treatment, the patient has consented to  Procedure(s): XI ROBOTIC ASSISTED SALPINGECTOMY (Bilateral) as a surgical intervention.  The patient's history has been reviewed, patient examined, no change in status, stable for surgery.  I have reviewed the patient's chart and labs.  Questions were answered to the patient's satisfaction.     Mikell Camp R Hanz Winterhalter

## 2021-09-30 LAB — SURGICAL PATHOLOGY

## 2021-10-07 ENCOUNTER — Ambulatory Visit (INDEPENDENT_AMBULATORY_CARE_PROVIDER_SITE_OTHER): Payer: BC Managed Care – PPO | Admitting: Obstetrics and Gynecology

## 2021-10-07 ENCOUNTER — Encounter: Payer: Self-pay | Admitting: Obstetrics and Gynecology

## 2021-10-07 ENCOUNTER — Other Ambulatory Visit: Payer: Self-pay

## 2021-10-07 VITALS — BP 128/80 | Ht 64.0 in | Wt 217.0 lb

## 2021-10-07 DIAGNOSIS — Z4889 Encounter for other specified surgical aftercare: Secondary | ICD-10-CM

## 2021-10-07 NOTE — Progress Notes (Signed)
°  Postoperative Follow-up Patient presents post op from  [facility] Robot-assisted Laparoscopic Bilateral Salpingectomy   for  Sterilization , 1 week ago.  Subjective: She reports that since the surgery she has been feeling very well. Patient reports marked improvement in her preop symptoms. Eating a regular diet without difficulty.  The patient is not having any pain.   Activity: normal activities of daily living.   Objective: BP 128/80    Ht 5\' 4"  (1.626 m)    Wt 217 lb (98.4 kg)    LMP 09/16/2021 (Approximate)    BMI 37.25 kg/m  Physical Exam Constitutional:      Appearance: Normal appearance. She is well-developed.  HENT:     Head: Normocephalic and atraumatic.  Eyes:     Extraocular Movements: Extraocular movements intact.     Pupils: Pupils are equal, round, and reactive to light.  Neck:     Thyroid: No thyromegaly.  Cardiovascular:     Rate and Rhythm: Normal rate and regular rhythm.     Heart sounds: Normal heart sounds.  Pulmonary:     Effort: Pulmonary effort is normal.     Breath sounds: Normal breath sounds.  Abdominal:     General: Bowel sounds are normal. There is no distension.     Palpations: Abdomen is soft. There is no mass.     Comments: Incisions are clean dry and intact  Musculoskeletal:     Cervical back: Neck supple.  Neurological:     Mental Status: She is alert and oriented to person, place, and time.  Skin:    General: Skin is warm and dry.  Psychiatric:        Behavior: Behavior normal.        Thought Content: Thought content normal.        Judgment: Judgment normal.  Vitals reviewed.    Assessment: s/p :   Xi Robot-assisted Laparoscopic Bilateral Salpingectomy  stable  Plan:  Patient has done well after surgery with no apparent complications.    I have discussed the post-operative course to date, and the expected progress moving forward.  The patient understands what complications to be concerned about.  I will see the patient in routine follow  up, or sooner if needed.    Activity plan: No restriction.     11/14/2021 MD, [facility], [facility] 10/07/2021 10:26 AM
